# Patient Record
Sex: Male | Born: 1935 | Race: White | Hispanic: No | Marital: Married | Smoking: Former smoker
Health system: Southern US, Community
[De-identification: ages and names within clinical notes are randomized; demographics above are authoritative.]

## PROBLEM LIST (undated history)

## (undated) DIAGNOSIS — I4891 Unspecified atrial fibrillation: Secondary | ICD-10-CM

## (undated) DIAGNOSIS — I639 Cerebral infarction, unspecified: Secondary | ICD-10-CM

## (undated) DIAGNOSIS — Z952 Presence of prosthetic heart valve: Secondary | ICD-10-CM

## (undated) DIAGNOSIS — K5190 Ulcerative colitis, unspecified, without complications: Secondary | ICD-10-CM

## (undated) DIAGNOSIS — J329 Chronic sinusitis, unspecified: Secondary | ICD-10-CM

## (undated) DIAGNOSIS — R4701 Aphasia: Secondary | ICD-10-CM

## (undated) DIAGNOSIS — N400 Enlarged prostate without lower urinary tract symptoms: Secondary | ICD-10-CM

## (undated) DIAGNOSIS — E785 Hyperlipidemia, unspecified: Secondary | ICD-10-CM

## (undated) DIAGNOSIS — I10 Essential (primary) hypertension: Secondary | ICD-10-CM

## (undated) DIAGNOSIS — I2510 Atherosclerotic heart disease of native coronary artery without angina pectoris: Secondary | ICD-10-CM

## (undated) HISTORY — DX: Chronic sinusitis, unspecified: J32.9

## (undated) HISTORY — DX: Presence of prosthetic heart valve: Z95.2

## (undated) HISTORY — DX: Unspecified atrial fibrillation: I48.91

## (undated) HISTORY — DX: Essential (primary) hypertension: I10

## (undated) HISTORY — DX: Atherosclerotic heart disease of native coronary artery without angina pectoris: I25.10

## (undated) HISTORY — PX: CARDIAC SURGERY: SHX584

## (undated) HISTORY — DX: Hyperlipidemia, unspecified: E78.5

## (undated) HISTORY — DX: Benign prostatic hyperplasia without lower urinary tract symptoms: N40.0

## (undated) HISTORY — PX: AORTIC VALVE REPAIR: SHX6306

## (undated) HISTORY — DX: Aphasia: R47.01

## (undated) HISTORY — PX: LUNG SURGERY: SHX703

---

## 2007-05-27 ENCOUNTER — Ambulatory Visit: Payer: Self-pay | Admitting: Thoracic Surgery (Cardiothoracic Vascular Surgery)

## 2007-06-17 ENCOUNTER — Ambulatory Visit: Payer: Self-pay | Admitting: Cardiothoracic Surgery

## 2007-06-25 ENCOUNTER — Ambulatory Visit: Payer: Self-pay | Admitting: Thoracic Surgery (Cardiothoracic Vascular Surgery)

## 2007-07-29 ENCOUNTER — Ambulatory Visit: Payer: Self-pay | Admitting: Thoracic Surgery (Cardiothoracic Vascular Surgery)

## 2012-12-06 ENCOUNTER — Observation Stay (HOSPITAL_COMMUNITY): Payer: Medicare Other

## 2012-12-06 ENCOUNTER — Observation Stay (HOSPITAL_BASED_OUTPATIENT_CLINIC_OR_DEPARTMENT_OTHER)
Admission: EM | Admit: 2012-12-06 | Discharge: 2012-12-08 | Disposition: A | Discharge status: Home / Self Care | Payer: Medicare Other | Attending: Family Medicine | Admitting: Family Medicine

## 2012-12-06 ENCOUNTER — Emergency Department (HOSPITAL_BASED_OUTPATIENT_CLINIC_OR_DEPARTMENT_OTHER): Payer: Medicare Other

## 2012-12-06 ENCOUNTER — Encounter (HOSPITAL_BASED_OUTPATIENT_CLINIC_OR_DEPARTMENT_OTHER): Payer: Self-pay | Admitting: *Deleted

## 2012-12-06 DIAGNOSIS — I6350 Cerebral infarction due to unspecified occlusion or stenosis of unspecified cerebral artery: Principal | ICD-10-CM | POA: Insufficient documentation

## 2012-12-06 DIAGNOSIS — J329 Chronic sinusitis, unspecified: Secondary | ICD-10-CM

## 2012-12-06 DIAGNOSIS — I639 Cerebral infarction, unspecified: Secondary | ICD-10-CM

## 2012-12-06 DIAGNOSIS — G459 Transient cerebral ischemic attack, unspecified: Secondary | ICD-10-CM

## 2012-12-06 DIAGNOSIS — I10 Essential (primary) hypertension: Secondary | ICD-10-CM

## 2012-12-06 DIAGNOSIS — Z952 Presence of prosthetic heart valve: Secondary | ICD-10-CM

## 2012-12-06 DIAGNOSIS — N400 Enlarged prostate without lower urinary tract symptoms: Secondary | ICD-10-CM

## 2012-12-06 DIAGNOSIS — I059 Rheumatic mitral valve disease, unspecified: Secondary | ICD-10-CM | POA: Insufficient documentation

## 2012-12-06 DIAGNOSIS — Z8673 Personal history of transient ischemic attack (TIA), and cerebral infarction without residual deficits: Secondary | ICD-10-CM | POA: Insufficient documentation

## 2012-12-06 DIAGNOSIS — R4701 Aphasia: Secondary | ICD-10-CM

## 2012-12-06 DIAGNOSIS — Z951 Presence of aortocoronary bypass graft: Secondary | ICD-10-CM | POA: Insufficient documentation

## 2012-12-06 DIAGNOSIS — I079 Rheumatic tricuspid valve disease, unspecified: Secondary | ICD-10-CM | POA: Insufficient documentation

## 2012-12-06 DIAGNOSIS — E785 Hyperlipidemia, unspecified: Secondary | ICD-10-CM

## 2012-12-06 HISTORY — DX: Cerebral infarction, unspecified: I63.9

## 2012-12-06 LAB — COMPREHENSIVE METABOLIC PANEL
ALT: 22 U/L (ref 0–53)
AST: 25 U/L (ref 0–37)
Alkaline Phosphatase: 74 U/L (ref 39–117)
CO2: 28 mEq/L (ref 19–32)
GFR calc Af Amer: 56 mL/min — ABNORMAL LOW (ref >90)
Glucose, Bld: 124 mg/dL — ABNORMAL HIGH (ref 70–99)
Potassium: 3.9 mEq/L (ref 3.5–5.1)
Sodium: 141 mEq/L (ref 135–145)
Total Protein: 7.8 g/dL (ref 6.0–8.3)

## 2012-12-06 LAB — PROTIME-INR
INR: 1.04 (ref 0.00–1.49)
Prothrombin Time: 13.5 seconds (ref 11.6–15.2)

## 2012-12-06 LAB — CREATININE, SERUM: GFR calc Af Amer: 62 mL/min — ABNORMAL LOW (ref >90)

## 2012-12-06 LAB — URINALYSIS, ROUTINE W REFLEX MICROSCOPIC
Bilirubin Urine: NEGATIVE
Hgb urine dipstick: NEGATIVE
Ketones, ur: NEGATIVE mg/dL
Nitrite: NEGATIVE
Specific Gravity, Urine: 1.012 (ref 1.005–1.030)
pH: 6 (ref 5.0–8.0)

## 2012-12-06 LAB — GLUCOSE, CAPILLARY: Glucose-Capillary: 133 mg/dL — ABNORMAL HIGH (ref 70–99)

## 2012-12-06 LAB — CBC
HCT: 35.4 % — ABNORMAL LOW (ref 39.0–52.0)
Hemoglobin: 12.3 g/dL — ABNORMAL LOW (ref 13.0–17.0)
MCHC: 34.7 g/dL (ref 30.0–36.0)
MCV: 92.2 fL (ref 78.0–100.0)
Platelets: 160 10*3/uL (ref 150–400)
RBC: 3.91 MIL/uL — ABNORMAL LOW (ref 4.22–5.81)
RDW: 12.6 % (ref 11.5–15.5)
RDW: 12.9 % (ref 11.5–15.5)
WBC: 9.2 10*3/uL (ref 4.0–10.5)

## 2012-12-06 LAB — DIFFERENTIAL
Basophils Absolute: 0.1 10*3/uL (ref 0.0–0.1)
Eosinophils Absolute: 0.3 10*3/uL (ref 0.0–0.7)
Lymphocytes Relative: 22 % (ref 12–46)
Lymphs Abs: 2.1 10*3/uL (ref 0.7–4.0)
Neutrophils Relative %: 65 % (ref 43–77)

## 2012-12-06 MED ORDER — SODIUM CHLORIDE 0.9 % IV SOLN
INTRAVENOUS | Status: AC
  Administered 2012-12-06: 19:00:00 via INTRAVENOUS

## 2012-12-06 MED ORDER — ONDANSETRON HCL 4 MG/2ML IJ SOLN: 4.0000 mg | Freq: Three times a day (TID) | INTRAMUSCULAR | Status: AC | PRN

## 2012-12-06 MED ORDER — FLUTICASONE PROPIONATE 50 MCG/ACT NA SUSP
1.0000 | Freq: Every day | NASAL | Status: DC
  Administered 2012-12-07 – 2012-12-08 (×2): 1 via NASAL
  Filled 2012-12-06: qty 16

## 2012-12-06 MED ORDER — ENOXAPARIN SODIUM 40 MG/0.4ML ~~LOC~~ SOLN
40.0000 mg | SUBCUTANEOUS | Status: DC
  Administered 2012-12-06 – 2012-12-07 (×2): 40 mg via SUBCUTANEOUS
  Filled 2012-12-06 (×3): qty 0.4

## 2012-12-06 MED ORDER — CLOPIDOGREL BISULFATE 75 MG PO TABS
75.0000 mg | ORAL_TABLET | Freq: Every day | ORAL | Status: DC
  Administered 2012-12-07 – 2012-12-08 (×2): 75 mg via ORAL
  Filled 2012-12-06 (×3): qty 1

## 2012-12-06 MED ORDER — CEFUROXIME AXETIL 250 MG PO TABS
250.0000 mg | ORAL_TABLET | Freq: Two times a day (BID) | ORAL | Status: DC
  Administered 2012-12-06 – 2012-12-08 (×5): 250 mg via ORAL
  Filled 2012-12-06 (×6): qty 1

## 2012-12-06 MED ORDER — ACETAMINOPHEN 325 MG PO TABS: 650.0000 mg | ORAL_TABLET | ORAL | Status: DC | PRN

## 2012-12-06 MED ORDER — ONDANSETRON HCL 4 MG/2ML IJ SOLN: 4.0000 mg | Freq: Four times a day (QID) | INTRAMUSCULAR | Status: DC | PRN

## 2012-12-06 MED ORDER — LORAZEPAM 0.5 MG PO TABS
0.5000 mg | ORAL_TABLET | Freq: Three times a day (TID) | ORAL | Status: DC
  Administered 2012-12-06 – 2012-12-07 (×2): 0.5 mg via ORAL
  Filled 2012-12-06 (×2): qty 1

## 2012-12-06 MED ORDER — MESALAMINE 1.2 G PO TBEC
1200.0000 mg | DELAYED_RELEASE_TABLET | Freq: Every day | ORAL | Status: DC
  Administered 2012-12-07 – 2012-12-08 (×2): 1.2 g via ORAL
  Filled 2012-12-06 (×3): qty 1

## 2012-12-06 MED ORDER — SIMVASTATIN 40 MG PO TABS
40.0000 mg | ORAL_TABLET | Freq: Every day | ORAL | Status: DC
  Administered 2012-12-06 – 2012-12-08 (×3): 40 mg via ORAL
  Filled 2012-12-06 (×3): qty 1

## 2012-12-06 MED ORDER — LORATADINE 10 MG PO TABS
10.0000 mg | ORAL_TABLET | Freq: Every day | ORAL | Status: DC
  Administered 2012-12-07 – 2012-12-08 (×2): 10 mg via ORAL
  Filled 2012-12-06 (×3): qty 1

## 2012-12-06 MED ORDER — CLOPIDOGREL BISULFATE 75 MG PO TABS
75.0000 mg | ORAL_TABLET | Freq: Once | ORAL | Status: AC
  Administered 2012-12-06: 75 mg via ORAL
  Filled 2012-12-06: qty 1

## 2012-12-06 MED ORDER — TAMSULOSIN HCL 0.4 MG PO CAPS
0.4000 mg | ORAL_CAPSULE | Freq: Every day | ORAL | Status: DC
Start: 2012-12-06 — End: 2012-12-08
  Administered 2012-12-06 – 2012-12-08 (×3): 0.4 mg via ORAL
  Filled 2012-12-06 (×3): qty 1

## 2012-12-06 NOTE — H&P (Signed)
History and Physical       Hospital Admission Note Date: 12/06/2012  Patient name: Caleb Delgado Medical record number: [id number] Date of birth: May 12, 1926 Age: 79 y.o. Gender: male PCP: Sid Falcon, MD    Chief Complaint:  Slurred speech around noon today  HPI: Patient is a 79 year old male with prior history of TIA, hypertension, hyperlipidemia, history of aortic valve replacement presented with slurred speech and unknown today. History was obtained from the patient and his wife present in the room. Patient was in church this morning, subsequently went to a restaurant for lunch and was unable to order his food, around 12 noon. He was eventually able to eat the food but his speech was somewhat slurred. His wife got concerned and drove him to [facility] ED. EDP discussed with on-call neurologist who did not feel that patient was a candidate for thrombolytics and recommended transfer to [facility]  for stroke/TIA workup.  The patient also reported that he was somewhat dizzy yesterday however did not have any syncopal episode, chest pain, palpitations or shortness of breath.  Review of Systems:  Constitutional: Denies fever, chills, diaphoresis, appetite change and fatigue.  HEENT: Denies photophobia, eye pain, redness, hearing loss, ear pain, congestion, sore throat, rhinorrhea, sneezing, mouth sores, trouble swallowing, neck pain, neck stiffness and tinnitus.   Respiratory: Denies SOB, DOE, cough, chest tightness,  and wheezing.   Cardiovascular: Denies chest pain, palpitations and leg swelling.  Gastrointestinal: Denies nausea, vomiting, abdominal pain, diarrhea, constipation, blood in stool and abdominal distention.  Genitourinary: Denies dysuria, urgency, frequency, hematuria, flank pain and difficulty urinating.  Musculoskeletal: Denies myalgias, back pain, joint swelling, arthralgias and gait problem.  Skin: Denies pallor, rash and  wound.  Neurological: Please see history of present illness  Hematological: Denies adenopathy. Easy bruising, personal or family bleeding history  Psychiatric/Behavioral: Denies suicidal ideation, mood changes, confusion, nervousness, sleep disturbance and agitation  Past Medical History: Past Medical History  Diagnosis Date  . Stroke     TIA   Past Surgical History  Procedure Laterality Date  . Cardiac surgery    . Aortic valve repair    . Lung surgery      Medications: Prior to Admission medications   Medication Sig Start Date End Date Taking? Authorizing Provider  aspirin 325 MG EC tablet Take 325 mg by mouth daily.   Yes Historical Provider, MD  bisoprolol (ZEBETA) 5 MG tablet Take 5 mg by mouth daily.   Yes Historical Provider, MD  LORazepam (ATIVAN) 0.5 MG tablet Take 0.5 mg by mouth every 8 (eight) hours.   Yes Historical Provider, MD  mesalamine (LIALDA) 1.2 G EC tablet Take 1,200 mg by mouth daily with breakfast.   Yes Historical Provider, MD  nebivolol (BYSTOLIC) 10 MG tablet Take 5 mg by mouth daily.    Yes Historical Provider, MD  simvastatin (ZOCOR) 40 MG tablet Take 40 mg by mouth every evening.   Yes Historical Provider, MD  tamsulosin (FLOMAX) 0.4 MG CAPS Take 0.4 mg by mouth.   Yes Historical Provider, MD    Allergies:  No Known Allergies  Social History:  reports that he quit smoking about 42 years ago. He does not have any smokeless tobacco history on file. He reports that he does not drink alcohol. His drug history is not on file.  Family History: No family history on file.  Physical Exam: Blood pressure 135/65, pulse 60, temperature 98 F (36.7 C), temperature source Oral, resp. rate 16, height 6\' 2"  (  1.88 m), weight 79.289 kg (174 lb 12.8 oz), SpO2 99.00%. General: Alert, awake, oriented x3, in no acute distress, has some expressive aphasia during conversation  HEENT: normocephalic, atraumatic, anicteric sclera, pink conjunctiva, PERLA, oropharynx  clear Neck: supple, no masses or lymphadenopathy, no goiter, no bruits  Heart: Regular rate and rhythm, without murmurs, rubs or gallops. Lungs: Clear to auscultation bilaterally, no wheezing, rales or rhonchi. Abdomen: Soft, nontender, nondistended, positive bowel sounds, no masses. Extremities: No clubbing, cyanosis or edema with positive pedal pulses. Neuro:  cranial nerves II- XII intact, strength 5/5 upper and lower extremities bilaterally, some expressive aphasia gait not tested  Psych: alert and oriented x 3, normal mood and affect Skin: no rashes or lesions, warm and dry   LABS on Admission:  Basic Metabolic Panel:  Recent Labs Lab 12/06/12 1355  NA 141  K 3.9  CL 101  CO2 28  GLUCOSE 124*  BUN 19  CREATININE 1.30  CALCIUM 9.8   Liver Function Tests:  Recent Labs Lab 12/06/12 1355  AST 25  ALT 22  ALKPHOS 74  BILITOT 0.8  PROT 7.8  ALBUMIN 4.2   No results found for this basename: LIPASE, AMYLASE,  in the last 168 hours No results found for this basename: AMMONIA,  in the last 168 hours CBC:  Recent Labs Lab 12/06/12 1355  WBC 9.2  NEUTROABS 6.0  HGB 12.6*  HCT 36.9*  MCV 94.4  PLT 160   Cardiac Enzymes:  Recent Labs Lab 12/06/12 1355  TROPONINI <0.30   BNP: No components found with this basename: POCBNP,  CBG:  Recent Labs Lab 12/06/12 1414  GLUCAP 133*     Radiological Exams on Admission: Ct Head Wo Contrast  12/06/2012  *RADIOLOGY REPORT*  Clinical Data: Expressive aphasia, remote history of TIA, Code stroke  CT HEAD WITHOUT CONTRAST  Technique:  Contiguous axial images were obtained from the base of the skull through the vertex without contrast.  Comparison: None.  Findings:  There is mild diffuse atrophy with mild prominence of bifrontal extra-axial spaces and commiserate mild ex vacuo dilatation of the ventricular system.  Scattered periventricular hypodensities compatible microvascular ischemic disease.  There is no CT evidence of  acute large territory infarct.  No intraparenchymal or extra- axial mass or hemorrhage.  Normal size and configuration of the ventricles and basilar cisterns gave an volume loss.  No midline shift.  Air-fluid level with the right maxillary sinus.  Remaining paranasal sinuses and mastoid air cells are normally aerated.  Post bilateral cataract surgery.  Regional soft tissues are normal.  No displaced calvarial fracture.  IMPRESSION: 1.  No acute intracranial process. 2.  Mild, likely age appropriate, atrophy and microvascular ischemic disease. 3.  Air-fluid level with the right maxillary sinus, nonspecific but may be seen in the setting of acute sinusitis.  Above findings discussed with Dr. Hyacinth Meeker at 14:16.   Original Report Authenticated By: Tacey Ruiz, MD     Assessment/Plan Principal Problem:   Expressive aphasia: Possible TIA/ CVA with a history of prior TIA on aspirin - Will admit for TIA/CVA workup, obtain MRI/MRA brain, 2-D echo, carotid Dopplers - Patient has been on full dose aspirin prior to admission, he has been given one dose of Plavix at [facility], will continue Plavix - PT, OT, speech evaluation, swallow evaluation, heart healthy diet once able to swallow - Lipid panel, hemoglobin A1c - Neurology consulted, discussed with Dr. Cyril Mourning  Active Problems:   HTN (hypertension): Currently stable, hold off  on beta blocker to avoid hypotension    Other and unspecified hyperlipidemia: Continue statin, obtain lipid panel    BPH (benign prostatic hyperplasia): Continue Flomax    S/P AVR (aortic valve replacement)   Acute sinusitis on CT : - Placed on Claritin, Flonase spray and Ceftin for a week   DVT prophylaxis: lovenox  CODE STATUS: Full code  Further plan will depend as patient's clinical course evolves and further radiologic and laboratory data become available.   Time Spent on Admission: 1 hour  RAI,RIPUDEEP M.D. [facility] 12/06/2012, 5:43 PM Pager:  [phone]  If 7PM-7AM, please contact night-coverage [email or web address] Password [email or web address]

## 2012-12-06 NOTE — ED Provider Notes (Signed)
History     CSN: [id number]  Arrival date & time 12/06/12  1344   First MD Initiated Contact with Patient 12/06/12 1357      No chief complaint on file.   (Consider location/radiation/quality/duration/timing/severity/associated sxs/prior treatment) HPI Comments: Level V caveat apply secondary to patient's expressive aphasia  The patient is an 79 year old male with a history of hypertension, cardiac surgery for both aortic valve repair and cardiac bypass. He presents after his spouse found that he was unable to order his food at a restaurant. He she states that she last hurt his voice normal at 8:30 this morning before church started, at this time he is unable to answer questions appropriately, he has no difficulty following commands with his arms or legs and has a steady gait.  The history is provided by the patient and the spouse.    Past Medical History  Diagnosis Date  . Stroke     TIA    Past Surgical History  Procedure Laterality Date  . Cardiac surgery    . Aortic valve repair    . Lung surgery      No family history on file.  History  Substance Use Topics  . Smoking status: Former Games developer  . Smokeless tobacco: Not on file  . Alcohol Use: No      Review of Systems  Unable to perform ROS: Patient nonverbal    Allergies  Review of patient's allergies indicates no known allergies.  Home Medications   Current Outpatient Rx  Name  Route  Sig  Dispense  Refill  . aspirin 325 MG EC tablet   Oral   Take 325 mg by mouth daily.         . bisoprolol (ZEBETA) 5 MG tablet   Oral   Take 5 mg by mouth daily.         Marland Kitchen LORazepam (ATIVAN) 0.5 MG tablet   Oral   Take 0.5 mg by mouth every 8 (eight) hours.         . mesalamine (LIALDA) 1.2 G EC tablet   Oral   Take 1,200 mg by mouth daily with breakfast.         . nebivolol (BYSTOLIC) 10 MG tablet   Oral   Take 5 mg by mouth daily.          . simvastatin (ZOCOR) 40 MG tablet   Oral   Take 40 mg  by mouth every evening.         . tamsulosin (FLOMAX) 0.4 MG CAPS   Oral   Take 0.4 mg by mouth.           BP 132/52  Pulse 60  Temp(Src) 98.1 F (36.7 C) (Oral)  Resp 20  SpO2 100%  Physical Exam  Nursing note and vitals reviewed. Constitutional: He appears well-developed and well-nourished. No distress.  HENT:  Head: Normocephalic and atraumatic.  Mouth/Throat: Oropharynx is clear and moist. No oropharyngeal exudate.  Eyes: Conjunctivae and EOM are normal. Pupils are equal, round, and reactive to light. Right eye exhibits no discharge. Left eye exhibits no discharge. No scleral icterus.  Neck: Normal range of motion. Neck supple. No JVD present. No thyromegaly present.  Cardiovascular: Normal rate, regular rhythm, normal heart sounds and intact distal pulses.  Exam reveals no gallop and no friction rub.   No murmur heard. Pulmonary/Chest: Effort normal and breath sounds normal. No respiratory distress. He has no wheezes. He has no rales.  Abdominal: Soft. Bowel sounds are  normal. He exhibits no distension and no mass. There is no tenderness.  Musculoskeletal: Normal range of motion. He exhibits no edema and no tenderness.  Lymphadenopathy:    He has no cervical adenopathy.  Neurological: He is alert. Coordination normal.  The patient has expressive aphasia, normal memory, normal coordination of all 4 extremities without any limb ataxia. The patient has normal strength of all 4 extremities at the major muscle groups including shoulders, biceps, triceps, forearm, grips, quadriceps, hamstrings, calves. Normal sensation to light touch and pinprick of all 4 extremities and the trunk. No truncal ataxia, normal gait, cranial nerves III through XII are intact.  Normal peripheral visual fields. Normal extraocular movements. Normal pupillary exam. No pronator drift, normal reflexes at the brachial radialis, biceps and patellar tendons. Normal position sense, normal temperature sensation to  cold and warm  Skin: Skin is warm and dry. No rash noted. No erythema.  Psychiatric: He has a normal mood and affect. His behavior is normal.    ED Course  Procedures (including critical care time)  Labs Reviewed  CBC - Abnormal; Notable for the following:    RBC 3.91 (*)    Hemoglobin 12.6 (*)    HCT 36.9 (*)    All other components within normal limits  GLUCOSE, CAPILLARY - Abnormal; Notable for the following:    Glucose-Capillary 133 (*)    All other components within normal limits  PROTIME-INR  APTT  DIFFERENTIAL  ETHANOL  COMPREHENSIVE METABOLIC PANEL  URINALYSIS, ROUTINE W REFLEX MICROSCOPIC  TROPONIN I   Ct Head Wo Contrast  12/06/2012  *RADIOLOGY REPORT*  Clinical Data: Expressive aphasia, remote history of TIA, Code stroke  CT HEAD WITHOUT CONTRAST  Technique:  Contiguous axial images were obtained from the base of the skull through the vertex without contrast.  Comparison: None.  Findings:  There is mild diffuse atrophy with mild prominence of bifrontal extra-axial spaces and commiserate mild ex vacuo dilatation of the ventricular system.  Scattered periventricular hypodensities compatible microvascular ischemic disease.  There is no CT evidence of acute large territory infarct.  No intraparenchymal or extra- axial mass or hemorrhage.  Normal size and configuration of the ventricles and basilar cisterns gave an volume loss.  No midline shift.  Air-fluid level with the right maxillary sinus.  Remaining paranasal sinuses and mastoid air cells are normally aerated.  Post bilateral cataract surgery.  Regional soft tissues are normal.  No displaced calvarial fracture.  IMPRESSION: 1.  No acute intracranial process. 2.  Mild, likely age appropriate, atrophy and microvascular ischemic disease. 3.  Air-fluid level with the right maxillary sinus, nonspecific but may be seen in the setting of acute sinusitis.  Above findings discussed with Dr. Hyacinth Meeker at 14:16.   Original Report Authenticated  By: Tacey Ruiz, MD      1. Stroke       MDM  The patient has had cardiac surgery, he is not have any carotid bruits his EKG shows LVH with intraventricular conduction delay but no signs of acute ischemia. I have activated code stroke as the patient was last seen normal 5-1/2 hours ago. Will discuss with the neurologist, vital signs are stable without any significant hypertension at this time.   ED ECG REPORT  I personally interpreted this EKG   Date: 12/06/2012   Rate: 61  Rhythm: normal sinus rhythm  QRS Axis: normal  Intervals: PR prolonged  ST/T Wave abnormalities: normal  Conduction Disutrbances:first-degree A-V block  and nonspecific intraventricular conduction delay  Narrative Interpretation: Left ventricular hypertrophy  Old EKG Reviewed: none available   On arrival after my immediate evaluation the patient was found to have strokelike symptoms and I paged a code stroke immediately. Discussion with the neurologist agreed that given his time frame under 6 hours that he was not a candidate for intravenous thrombolytics and after viewing the CT scan without any evidence of a large territory infarct it was determined that the patient would not be a candidate for local thrombolytics either. I have discussed his care with the [facility] hospitalist who will admit the patient to the hospital, Dr. Brien Few will admit to neuro telemetry floor.  Recommendations on medications including aspirin and Plavix      Vida Roller, MD 12/06/12 1440

## 2012-12-06 NOTE — Progress Notes (Signed)
Patient arrived from [facility]. Stroke swallow was completed and documented from that location. Diet for Heart Healthy was placed.

## 2012-12-06 NOTE — ED Notes (Signed)
Patient presents with garbled speech, unable to get out his words, answers questions inappropriately. Gait and motor intact. Patient has had a TIA in 1982. Last seen normal per wife was apx 8:30am prior to church this morning. States that at lunch she noticed he was unable to get the correct order spoken.

## 2012-12-06 NOTE — ED Notes (Signed)
Patients speech improving from when he first arrived into the ER.

## 2012-12-06 NOTE — Consult Note (Signed)
Referring Physician: Rai    Chief Complaint: Difficulty with speech  HPI: Caleb Delgado is an 79 y.o. male who reports awakening today normal.  The patient went to church but did not have an opportunity to speak until after church when he went to lunch and was attempting to order.  He was unable to get his words out correctly.  Speech was garbled.  Speech did not improve.  Patient went home then soon presented to the ED for further evaluation.  Speech did improve not long after presentation but has not returned to baseline.   Patient does report feeling poorly yesterday.  Reports feeling weak and dizzy.  This improved by 10AM and patient was normal when he went to bed last evening.  Does not report recurrence of these symptoms today.    LSN: [id number] tPA Given: No: Outside time window  Past Medical History  Diagnosis Date  . Stroke     TIA    Past Surgical History  Procedure Laterality Date  . Cardiac surgery    . Aortic valve repair    . Lung surgery      Family history:  Parents are deceased.  Both died of strokes.  He has 6 siblings. 5 siblings have passed of COPD, stroke, bowel obstruction, CHF.  His living sibling has macular degeneration.    Social History:  reports that he quit smoking about 42 years ago. He does not have any smokeless tobacco history on file. He reports that he does not drink alcohol. His drug history is not on file.  Allergies: No Known Allergies  Medications:  I have reviewed the patient's current medications. Prior to Admission:  Prescriptions prior to admission  Medication Sig Dispense Refill  . aspirin 325 MG EC tablet Take 325 mg by mouth daily.      Marland Kitchen LORazepam (ATIVAN) 0.5 MG tablet Take 0.5 mg by mouth at bedtime.      . mesalamine (LIALDA) 1.2 G EC tablet Take 1,200 mg by mouth daily with breakfast.      . nebivolol (BYSTOLIC) 10 MG tablet Take 5 mg by mouth daily.       . simvastatin (ZOCOR) 40 MG tablet Take 40 mg by mouth every evening.       . tamsulosin (FLOMAX) 0.4 MG CAPS Take 0.4 mg by mouth daily after supper.       Scheduled: . cefUROXime  250 mg Oral BID WC  . [START ON 12/07/2012] clopidogrel  75 mg Oral Q breakfast  . enoxaparin (LOVENOX) injection  40 mg Subcutaneous Q24H  . fluticasone  1 spray Each Nare Daily  . loratadine  10 mg Oral Daily  . LORazepam  0.5 mg Oral Q8H  . [START ON 12/07/2012] mesalamine  1,200 mg Oral Q breakfast  . simvastatin  40 mg Oral q1800  . tamsulosin  0.4 mg Oral QPC supper    ROS: History obtained from the patient  General ROS: negative for - chills, fatigue, fever, night sweats, weight gain or weight loss Psychological ROS: negative for - behavioral disorder, hallucinations, memory difficulties, mood swings or suicidal ideation Ophthalmic ROS: negative for - blurry vision, double vision, eye pain or loss of vision ENT ROS: negative for - epistaxis, nasal discharge, oral lesions, sore throat, tinnitus or vertigo Allergy and Immunology ROS: negative for - hives or itchy/watery eyes Hematological and Lymphatic ROS: negative for - bleeding problems, bruising or swollen lymph nodes Endocrine ROS: negative for - galactorrhea, hair pattern changes, polydipsia/polyuria  or temperature intolerance Respiratory ROS: negative for - cough, hemoptysis, shortness of breath or wheezing Cardiovascular ROS: negative for - chest pain, dyspnea on exertion, edema or irregular heartbeat Gastrointestinal ROS: negative for - abdominal pain, diarrhea, hematemesis, nausea/vomiting or stool incontinence Genito-Urinary ROS: negative for - dysuria, hematuria, incontinence or urinary frequency/urgency Musculoskeletal ROS: negative for - joint swelling or muscular weakness Neurological ROS: as noted in HPI Dermatological ROS: negative for rash and skin lesion changes  Physical Examination: Blood pressure 135/65, pulse 60, temperature 98 F (36.7 C), temperature source Oral, resp. rate 16, height 6\' 2"  (1.88 m),  weight 79.289 kg (174 lb 12.8 oz), SpO2 99.00%.  Neurologic Examination: Mental Status: Alert, oriented, thought content appropriate.  Speech nonfluent with word finding difficulties noted.  Able to follow 3 step commands without difficulty. Cranial Nerves: II: Discs flat bilaterally; Visual fields grossly normal, pupils equal, round, reactive to light and accommodation III,IV, VI: ptosis not present, extra-ocular motions intact bilaterally V,VII: mild right facial droop, facial light touch sensation normal bilaterally VIII: hearing normal bilaterally IX,X: gag reflex present XI: bilateral shoulder shrug XII: midline tongue extension Motor: Right : Upper extremity   5/5 w/5- hand grip    Left:     Upper extremity   5/5  Lower extremity   5/5       Lower extremity   5/5 Tone and bulk:normal tone throughout; no atrophy noted Sensory: Pinprick and light touch intact throughout, bilaterally Deep Tendon Reflexes: 2+ in the upper extremities and at the knees bilaterally.  Absent AJ's. Plantars: Right: downgoing   Left: upgoing Cerebellar: normal finger-to-nose and normal heel-to-shin test Gait: Unable to test CV: pulses palpable throughout   Laboratory Studies:  Basic Metabolic Panel:  Recent Labs Lab 12/06/12 1355 12/06/12 1741  NA 141  --   K 3.9  --   CL 101  --   CO2 28  --   GLUCOSE 124*  --   BUN 19  --   CREATININE 1.30 1.21  CALCIUM 9.8  --     Liver Function Tests:  Recent Labs Lab 12/06/12 1355  AST 25  ALT 22  ALKPHOS 74  BILITOT 0.8  PROT 7.8  ALBUMIN 4.2   No results found for this basename: LIPASE, AMYLASE,  in the last 168 hours No results found for this basename: AMMONIA,  in the last 168 hours  CBC:  Recent Labs Lab 12/06/12 1355 12/06/12 1741  WBC 9.2 8.6  NEUTROABS 6.0  --   HGB 12.6* 12.3*  HCT 36.9* 35.4*  MCV 94.4 92.2  PLT 160 150    Cardiac Enzymes:  Recent Labs Lab 12/06/12 1355  TROPONINI <0.30    BNP: No components  found with this basename: POCBNP,   CBG:  Recent Labs Lab 12/06/12 1414  GLUCAP 133*    Microbiology: No results found for this or any previous visit.  Coagulation Studies:  Recent Labs  12/06/12 1355  LABPROT 13.5  INR 1.04    Urinalysis:  Recent Labs Lab 12/06/12 1540  COLORURINE YELLOW  LABSPEC 1.012  PHURINE 6.0  GLUCOSEU NEGATIVE  HGBUR NEGATIVE  BILIRUBINUR NEGATIVE  KETONESUR NEGATIVE  PROTEINUR NEGATIVE  UROBILINOGEN 0.2  NITRITE NEGATIVE  LEUKOCYTESUR NEGATIVE    Lipid Panel: No results found for this basename: chol, trig, hdl, cholhdl, vldl, ldlcalc    HgbA1C:  No results found for this basename: HGBA1C    Urine Drug Screen:   No results found for this basename: labopia, cocainscrnur, labbenz,  amphetmu, thcu, labbarb    Alcohol Level:  Recent Labs Lab 12/06/12 1355  ETH <11    Imaging: Ct Head Wo Contrast  12/06/2012  *RADIOLOGY REPORT*  Clinical Data: Expressive aphasia, remote history of TIA, Code stroke  CT HEAD WITHOUT CONTRAST  Technique:  Contiguous axial images were obtained from the base of the skull through the vertex without contrast.  Comparison: None.  Findings:  There is mild diffuse atrophy with mild prominence of bifrontal extra-axial spaces and commiserate mild ex vacuo dilatation of the ventricular system.  Scattered periventricular hypodensities compatible microvascular ischemic disease.  There is no CT evidence of acute large territory infarct.  No intraparenchymal or extra- axial mass or hemorrhage.  Normal size and configuration of the ventricles and basilar cisterns gave an volume loss.  No midline shift.  Air-fluid level with the right maxillary sinus.  Remaining paranasal sinuses and mastoid air cells are normally aerated.  Post bilateral cataract surgery.  Regional soft tissues are normal.  No displaced calvarial fracture.  IMPRESSION: 1.  No acute intracranial process. 2.  Mild, likely age appropriate, atrophy and  microvascular ischemic disease. 3.  Air-fluid level with the right maxillary sinus, nonspecific but may be seen in the setting of acute sinusitis.  Above findings discussed with Dr. Hyacinth Meeker at 14:16.   Original Report Authenticated By: Tacey Ruiz, MD     Assessment: 79 y.o. male presenting with an expressive aphasia.  Able to follow commands and understand speech.  Mild right focality noted.  Head CT unremarkable.  Acute infarct suspected.  Patient outside window for tPA.  On ASA at home.    Stroke Risk Factors - none  Plan: 1. HgbA1c, fasting lipid panel 2. MRI, MRA  of the brain without contrast 3. PT consult, OT consult, Speech consult 4. Echocardiogram 5. Carotid dopplers 6. Prophylactic therapy-Antiplatelet med: Plavix - dose 75mg  daily 7. Risk factor modification 8. Telemetry monitoring 9. Frequent neuro checks   Thana Farr, MD [facility] [phone] 12/06/2012, 7:31 PM

## 2012-12-06 NOTE — ED Notes (Signed)
Patient transported to CT 

## 2012-12-07 DIAGNOSIS — R4789 Other speech disturbances: Secondary | ICD-10-CM

## 2012-12-07 NOTE — Progress Notes (Signed)
  Echocardiogram 2D Echocardiogram has been performed.  CHUNG, KWONG 12/07/2012, 12:00 PM

## 2012-12-07 NOTE — Progress Notes (Signed)
[facility] PROGRESS NOTE  Lugene Beougher WJX:[id number] DOB: [birth year] DOA: 12/06/2012 PCP: Sid Falcon, MD  Assessment/Plan: 1. CVA-MRI shows  Small acute infarct posterior left temporal lobe (posterior left  opercular region) and left periatrial region.  Patient is currently on aspirin, carotid dopplers are negative, TEE has been ordered for am.  2.Hypertension- BP has been low, antihypertensives are on hold.  3.Hyperlipidemia- continue zocor  4. BPH- continue with flomax.   Code Status: Full code Family Communication: Discussed with patient's wife at bedside Disposition Plan: To be deternmined   Consultants:  Neurology  Procedures:  Carotid dopplers  2 d echo  Antibiotics:  None  HPI/Subjective: Patient admitted with slurred speech and expressive aphasia, though he has improved he still has some expressive aphasia.  Objective: Filed Vitals:   12/07/12 0147 12/07/12 0601 12/07/12 1113 12/07/12 1425  BP: 118/50 130/58 122/52 107/53  Pulse: 52 51 54 56  Temp: 98 F (36.7 C) 97.9 F (36.6 C) 98 F (36.7 C) 97.8 F (36.6 C)  TempSrc: Oral Oral    Resp: 20 20 16 16   Height:      Weight:      SpO2: 97% 98% 100% 98%    Intake/Output Summary (Last 24 hours) at 12/07/12 1512 Last data filed at 12/07/12 0815  Gross per 24 hour  Intake    360 ml  Output      0 ml  Net    360 ml   Filed Weights   12/06/12 1537 12/06/12 1905  Weight: 79.289 kg (174 lb 12.8 oz) 78.744 kg (173 lb 9.6 oz)    Exam:   General:  Appears in no acute disterss  Cardiovascular: s1s2 RRR  Respiratory: Clear bilaterally  Abdomen: soft, nontender  Neuro: AO x 3, moving all extremities, has some expressive aphasia   Data Reviewed: Basic Metabolic Panel:  Recent Labs Lab 12/06/12 1355 12/06/12 1741  NA 141  --   K 3.9  --   CL 101  --   CO2 28  --   GLUCOSE 124*  --   BUN 19  --   CREATININE 1.30 1.21  CALCIUM 9.8  --    Liver Function  Tests:  Recent Labs Lab 12/06/12 1355  AST 25  ALT 22  ALKPHOS 74  BILITOT 0.8  PROT 7.8  ALBUMIN 4.2   No results found for this basename: LIPASE, AMYLASE,  in the last 168 hours No results found for this basename: AMMONIA,  in the last 168 hours CBC:  Recent Labs Lab 12/06/12 1355 12/06/12 1741  WBC 9.2 8.6  NEUTROABS 6.0  --   HGB 12.6* 12.3*  HCT 36.9* 35.4*  MCV 94.4 92.2  PLT 160 150   Cardiac Enzymes:  Recent Labs Lab 12/06/12 1355  TROPONINI <0.30   BNP (last 3 results) No results found for this basename: PROBNP,  in the last 8760 hours CBG:  Recent Labs Lab 12/06/12 1414  GLUCAP 133*    No results found for this or any previous visit (from the past 240 hour(s)).   Studies: Ct Head Wo Contrast  12/06/2012  *RADIOLOGY REPORT*  Clinical Data: Expressive aphasia, remote history of TIA, Code stroke  CT HEAD WITHOUT CONTRAST  Technique:  Contiguous axial images were obtained from the base of the skull through the vertex without contrast.  Comparison: None.  Findings:  There is mild diffuse atrophy with mild prominence of bifrontal extra-axial spaces and commiserate mild ex vacuo dilatation of the  ventricular system.  Scattered periventricular hypodensities compatible microvascular ischemic disease.  There is no CT evidence of acute large territory infarct.  No intraparenchymal or extra- axial mass or hemorrhage.  Normal size and configuration of the ventricles and basilar cisterns gave an volume loss.  No midline shift.  Air-fluid level with the right maxillary sinus.  Remaining paranasal sinuses and mastoid air cells are normally aerated.  Post bilateral cataract surgery.  Regional soft tissues are normal.  No displaced calvarial fracture.  IMPRESSION: 1.  No acute intracranial process. 2.  Mild, likely age appropriate, atrophy and microvascular ischemic disease. 3.  Air-fluid level with the right maxillary sinus, nonspecific but may be seen in the setting of acute  sinusitis.  Above findings discussed with Dr. Hyacinth Meeker at 14:16.   Original Report Authenticated By: Tacey Ruiz, MD    Mri Brain Without Contrast  12/06/2012  *RADIOLOGY REPORT*  Clinical Data:  Difficulty gathering words.  Expressive ectasia. Remote infarct.  MRI BRAIN WITHOUT CONTRAST MRA HEAD WITHOUT CONTRAST  Technique: Multiplanar, multiecho pulse sequences of the brain and surrounding structures were obtained according to standard protocol without intravenous contrast.  Angiographic images of the head were obtained using MRA technique without contrast.  Comparison: 12/06/2012 head CT.  MRI HEAD  Findings:  Small acute infarct posterior left temporal lobe (posterior left opercular region) and left periatrial region.  At the level of the posterior left frontal lobe infarct, there is a curvilinear structure which is suspect represents thrombus within the left middle cerebral artery vessel rather than hemorrhage. There is slow flow within branches distal to this region as best appreciated on FLAIR imaging.  Global atrophy without hydrocephalus.  No intracranial mass lesion detected on this unenhanced exam.  Right maxillary sinus is small mild to moderate mucosal thickening. Mild mucosal thickening ethmoid sinus air cells.  IMPRESSION: Small acute infarct posterior left temporal lobe (posterior left opercular region) and left periatrial region.  Please see above  MRA HEAD  Findings: Aplastic A1 segment right anterior cerebral artery.  The left internal carotid artery is larger than the right internal carotid artery which may be congenital although proximal right internal carotid artery stenosis not  excluded.  Decreased number visualized left middle cerebral artery branches consistent with the patient's acute infarct.  Tiny bulge superior border M1 segment right middle cerebral artery probably origin of a vessel rather than aneurysm.  Ectatic vertebral arteries and basilar artery.  Right vertebral artery is  dominant.  Moderate narrowing left vertebral artery. Nonvisualization left PICA.  No significant stenosis basilar artery.  Nonvisualization PICAs.  Mild narrowing and posterior cerebral artery distal branches more notable on the right.  IMPRESSION: Decreased number visualized left middle cerebral artery branches consistent with the patient's acute infarct.  Moderate narrowing left vertebral artery.  Nonvisualization left PICA.  Please see above.  Critical Value/emergent results were called by telephone at the time of interpretation on 12/06/2012 at 8:32 p.m. to Intha the patient's nurse, who verbally acknowledged these results.   Original Report Authenticated By: Lacy Duverney, M.D.    Mr Mra Head/brain Wo Cm  12/06/2012  *RADIOLOGY REPORT*  Clinical Data:  Difficulty gathering words.  Expressive ectasia. Remote infarct.  MRI BRAIN WITHOUT CONTRAST MRA HEAD WITHOUT CONTRAST  Technique: Multiplanar, multiecho pulse sequences of the brain and surrounding structures were obtained according to standard protocol without intravenous contrast.  Angiographic images of the head were obtained using MRA technique without contrast.  Comparison: 12/06/2012 head CT.  MRI HEAD  Findings:  Small acute infarct posterior left temporal lobe (posterior left opercular region) and left periatrial region.  At the level of the posterior left frontal lobe infarct, there is a curvilinear structure which is suspect represents thrombus within the left middle cerebral artery vessel rather than hemorrhage. There is slow flow within branches distal to this region as best appreciated on FLAIR imaging.  Global atrophy without hydrocephalus.  No intracranial mass lesion detected on this unenhanced exam.  Right maxillary sinus is small mild to moderate mucosal thickening. Mild mucosal thickening ethmoid sinus air cells.  IMPRESSION: Small acute infarct posterior left temporal lobe (posterior left opercular region) and left periatrial region.  Please  see above  MRA HEAD  Findings: Aplastic A1 segment right anterior cerebral artery.  The left internal carotid artery is larger than the right internal carotid artery which may be congenital although proximal right internal carotid artery stenosis not  excluded.  Decreased number visualized left middle cerebral artery branches consistent with the patient's acute infarct.  Tiny bulge superior border M1 segment right middle cerebral artery probably origin of a vessel rather than aneurysm.  Ectatic vertebral arteries and basilar artery.  Right vertebral artery is dominant.  Moderate narrowing left vertebral artery. Nonvisualization left PICA.  No significant stenosis basilar artery.  Nonvisualization PICAs.  Mild narrowing and posterior cerebral artery distal branches more notable on the right.  IMPRESSION: Decreased number visualized left middle cerebral artery branches consistent with the patient's acute infarct.  Moderate narrowing left vertebral artery.  Nonvisualization left PICA.  Please see above.  Critical Value/emergent results were called by telephone at the time of interpretation on 12/06/2012 at 8:32 p.m. to Intha the patient's nurse, who verbally acknowledged these results.   Original Report Authenticated By: Lacy Duverney, M.D.     Scheduled Meds: . cefUROXime  250 mg Oral BID WC  . clopidogrel  75 mg Oral Q breakfast  . enoxaparin (LOVENOX) injection  40 mg Subcutaneous Q24H  . fluticasone  1 spray Each Nare Daily  . loratadine  10 mg Oral Daily  . LORazepam  0.5 mg Oral Q8H  . mesalamine  1,200 mg Oral Q breakfast  . simvastatin  40 mg Oral q1800  . tamsulosin  0.4 mg Oral QPC supper   Continuous Infusions: . sodium chloride 75 mL/hr at 12/06/12 1902    Principal Problem:   Expressive aphasia Active Problems:   HTN (hypertension)   Other and unspecified hyperlipidemia   BPH (benign prostatic hyperplasia)   history of TIA   S/P AVR (aortic valve replacement)   Unspecified  sinusitis (chronic)    Time spent: 25 min    [facility]  [facility] Pager [phone]. If 7PM-7AM, please contact night-coverage at [email or web address], password [facility] 12/07/2012, 3:12 PM  LOS: 1 day

## 2012-12-07 NOTE — Evaluation (Signed)
Occupational Therapy Evaluation Patient Details Name: Caleb Delgado MRN: [id number] DOB: [birth year] Today's Date: 12/07/2012 Time: 0865-7846 OT Time Calculation (min): 30 min  OT Assessment / Plan / Recommendation Clinical Impression  79 yo male admitted due to slurred speech and MRI reveals Lt temporal posterior infarct at this time. OT to sign off acutely. Recommend outpatient and SLP . Pt demonstrates word finding deficits    OT Assessment  Patient needs continued OT Services    Follow Up Recommendations  Outpatient OT    Barriers to Discharge      Equipment Recommendations       Recommendations for Other Services    Frequency       Precautions / Restrictions Precautions Precautions: Fall Restrictions Weight Bearing Restrictions: No   Pertinent Vitals/Pain     ADL  Grooming: Wash/dry hands;Wash/dry face;Teeth care;Brushing hair;Modified independent Where Assessed - Grooming: Unsupported standing Lower Body Dressing: Modified independent Where Assessed - Lower Body Dressing: Unsupported sitting Toilet Transfer: Supervision/safety Toilet Transfer Method: Sit to Barista: Regular height toilet Equipment Used: Gait belt Transfers/Ambulation Related to ADLs: Pt completed the DGi and indicates a fall risk due to balance deficits . pt walking with decr gait velocity and guarded due to self recogntiion of balance deficits ADL Comments: Pt will have wife (A) at d/c home.pt could benefit from high balance therapy at outpatient. Pt is a fall risk in the community ambulating. Pt is unsafe to drive at this time due to delayed response time.     OT Diagnosis:    OT Problem List:   OT Treatment Interventions:     OT Goals    Visit Information  Last OT Received On: 12/07/12 Assistance Needed: +1 PT/OT Co-Evaluation/Treatment: Yes    Subjective Data  Subjective: "comb "- pt required increased time and cueing to produce the term "comb " as a need  in the bathroom Patient Stated Goal: to return home today- very anxious to leave   Prior Functioning     Home Living Lives With: Spouse Available Help at Discharge: Family;Available 24 hours/day Type of Home:  (town home) Home Access: Stairs to enter Secretary/administrator of Steps: 1 Entrance Stairs-Rails: None Home Layout: One level Bathroom Shower/Tub: Health visitor: Handicapped height Home Adaptive Equipment: Built-in shower seat Additional Comments: wife self reports falling x5 times on pavement striking forehead each time. Wife reports not talking to doctor about it and that patient is much more stable on his feet than she is normally. daughter present in room and states that patient's wife has difficulty with depth with change surface heights.  Prior Function Level of Independence: Independent Able to Take Stairs?: Yes Driving: Yes Vocation: Retired Musician: Expressive difficulties (word finding difficulties) Dominant Hand: Right         Vision/Perception Vision - History Baseline Vision: Wears glasses all the time Patient Visual Report: No change from baseline   Cognition  Cognition Overall Cognitive Status: Impaired Area of Impairment: Awareness of deficits Arousal/Alertness: Awake/alert Orientation Level: Oriented X4 / Intact (with increased time due to word finding diffiulties) Behavior During Session: [facility] for tasks performed Awareness of Deficits: decreased insight Cognition - Other Comments: delayed response to questioning due to word finding deficits and delayed processing of questioned asked. Pt is able to repeat the question back to therapist and laughs.     Extremity/Trunk Assessment Right Upper Extremity Assessment RUE ROM/Strength/Tone: WFL for tasks assessed RUE Sensation: WFL - Light Touch Left Upper Extremity Assessment  LUE ROM/Strength/Tone: WFL for tasks assessed LUE Sensation: WFL - Light Touch Right  Lower Extremity Assessment RLE ROM/Strength/Tone: WFL for tasks assessed RLE Sensation: WFL - Light Touch Left Lower Extremity Assessment LLE ROM/Strength/Tone: WFL for tasks assessed LLE Sensation: WFL - Light Touch Trunk Assessment Trunk Assessment: Kyphotic     Mobility Bed Mobility Bed Mobility: Not assessed Transfers Sit to Stand: 4: Min guard;With upper extremity assist;From bed Stand to Sit: 4: Min guard;With upper extremity assist;To chair/3-in-1 Details for Transfer Assistance: increaed time, guarded mvmt     Exercise     Balance Standardized Balance Assessment Standardized Balance Assessment: Dynamic Gait Index Dynamic Gait Index Level Surface: Mild Impairment Change in Gait Speed: Mild Impairment Gait with Horizontal Head Turns: Mild Impairment Gait with Vertical Head Turns: Mild Impairment Gait and Pivot Turn: Mild Impairment Step Over Obstacle: Moderate Impairment Step Around Obstacles: Mild Impairment Steps: Mild Impairment Total Score: 15 High Level Balance High Level Balance Activites: Turns;Direction changes;Sudden stops;Head turns   End of Session OT - End of Session Activity Tolerance: Patient tolerated treatment well Patient left: in chair;with call bell/phone within reach;with family/visitor present Nurse Communication: Mobility status;Precautions  GO Functional Assessment Tool Used: clinical judgement Functional Limitation: Self care Self Care Current Status (B1478): At least 1 percent but less than 20 percent impaired, limited or restricted Self Care Goal Status (G9562): At least 1 percent but less than 20 percent impaired, limited or restricted Self Care Discharge Status [phone]): At least 1 percent but less than 20 percent impaired, limited or restricted   Lucile Shutters 12/07/2012, 10:44 AM Pager: [phone]

## 2012-12-07 NOTE — Plan of Care (Signed)
Problem: Phase II Progression Outcomes Goal: Able to communicate Outcome: Progressing Demonstrates word finding deficits throughout OT evaluation. Pt self reports having deficits finding words. Pt repeating statements due to inability to form new words to answer

## 2012-12-07 NOTE — Evaluation (Signed)
Speech Language Pathology Evaluation Patient Details Name: Caleb Delgado MRN: [id number] DOB: 03-Jun-1926 Today's Date: 12/07/2012 Time: 1478-2956 SLP Time Calculation (min): 38 min  Problem List:  Patient Active Problem List  Diagnosis  . Expressive aphasia  . HTN (hypertension)  . Other and unspecified hyperlipidemia  . BPH (benign prostatic hyperplasia)  . history of TIA  . S/P AVR (aortic valve replacement)  . Unspecified sinusitis (chronic)   Past Medical History:  Past Medical History  Diagnosis Date  . Stroke     TIA   Past Surgical History:  Past Surgical History  Procedure Laterality Date  . Cardiac surgery    . Aortic valve repair    . Lung surgery     HPI:  Caleb Delgado is an 79 y.o. male who reports awakening 12/06/12 normal. The patient went to church but did not have an opportunity to speak until after church when he went to lunch and was attempting to order. He was unable to get his words out correctly. Speech was garbled. Speech did not improve. Patient went home then soon presented to the ED for further evaluation. Speech did improve not long after presentation but has not returned to baseline.     Assessment / Plan / Recommendation Clinical Impression  Patient presents with a mild-moderate expressive aphasia, characterized by dysnomia.  Patient responds well to phonemic cues.      SLP Assessment  Patient needs continued Speech Lanaguage Pathology Services    Follow Up Recommendations  Outpatient SLP;24 hour supervision/assistance    Frequency and Duration min 2x/week  2 weeks   Pertinent Vitals/Pain n/a   SLP Goals  SLP Goals Potential to Achieve Goals: Good Progress/Goals/Alternative treatment plan discussed with pt/caregiver and they: Agree SLP Goal #1: Patient will participate in various naming tasks with min. cues. SLP Goal #2: Patient will utilize compensatory strategies to complete his thought when experiencing dysnomia in  conversation.  SLP Evaluation Prior Functioning  Cognitive/Linguistic Baseline: Baseline deficits Baseline deficit details: Wife reports patient had some "memory" and word finding deficits prior to this CVA. 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.)   Cognition  Overall Cognitive Status: Appears within functional limits for tasks assessed Arousal/Alertness: Awake/alert Orientation Level: Oriented X4 Attention:  (WFL) Memory: Appears intact Awareness: Appears intact Problem Solving: Appears intact Safety/Judgment: Appears intact    Comprehension  Auditory Comprehension Overall Auditory Comprehension: Appears within functional limits for tasks assessed Yes/No Questions: Within Functional Limits Commands: Within Functional Limits Conversation: Simple EffectiveTechniques: Extra processing time Visual Recognition/Discrimination Discrimination: Not tested Reading Comprehension Reading Status: Not tested    Expression Expression Primary Mode of Expression: Verbal Verbal Expression Overall Verbal Expression: Impaired Initiation: No impairment Level of Generative/Spontaneous Verbalization: Sentence Repetition: No impairment Naming: Impairment Responsive: 76-100% accurate Confrontation: Impaired Convergent: 75-100% accurate Divergent: 75-100% accurate Verbal Errors: Perseveration Pragmatics: No impairment Effective Techniques: Semantic cues;Sentence completion;Articulatory cues Non-Verbal Means of Communication: Not applicable Written Expression Dominant Hand: Right Written Expression: Not tested   Oral / Motor Oral Motor/Sensory Function Overall Oral Motor/Sensory Function: Appears within functional limits for tasks assessed Motor Speech Overall Motor Speech: Appears within functional limits for tasks  assessed Respiration: Within functional limits Phonation: Normal Resonance: Within functional limits Articulation: Within functional limitis Intelligibility: Intelligible Motor Planning: Witnin functional limits Motor Speech Errors: Not applicable   GO Functional Assessment Tool  Used: Informal speech/language assessment; Skilled observation Functional Limitations: Spoken language expressive Spoken Language Expression Current Status [phone]): At least 20 percent but less than 40 percent impaired, limited or restricted Spoken Language Expression Discharge Status [phone]): At least 1 percent but less than 20 percent impaired, limited or restricted   Maryjo Rochester T 12/07/2012, 3:51 PM

## 2012-12-07 NOTE — Evaluation (Signed)
Physical Therapy Evaluation Patient Details Name: Caleb Delgado MRN: [id number] DOB: [birth year] Today's Date: 12/07/2012 Time: 4098-1191 PT Time Calculation (min): 28 min  PT Assessment / Plan / Recommendation Clinical Impression  Pt with small acute infacrt to post L temporal lobe presenting with expressive aphasia, word finding difficulties, delayed processing/increased response time and mild balance impairment noted by a score of 15 on the DGI. Pt with good home set up and support. Anticipate pt to be safe for d/c home with assist of spouse. Acute PT to follow to address balance impairment.    PT Assessment  Patient needs continued PT services    Follow Up Recommendations  Supervision/Assistance - 24 hour;No PT follow up    Does the patient have the potential to tolerate intense rehabilitation      Barriers to Discharge None      Equipment Recommendations  None recommended by PT    Recommendations for Other Services Speech consult   Frequency Min 3X/week    Precautions / Restrictions Precautions Precautions: Fall Restrictions Weight Bearing Restrictions: No   Pertinent Vitals/Pain Denies pain      Mobility  Bed Mobility Bed Mobility: Not assessed Transfers Transfers: Sit to Stand;Stand to Sit Sit to Stand: 4: Min guard;With upper extremity assist;From bed Stand to Sit: 4: Min guard;With upper extremity assist;To chair/3-in-1 Details for Transfer Assistance: increaed time, guarded mvmt Ambulation/Gait Ambulation/Gait Assistance: 4: Min guard Ambulation Distance (Feet): 200 Feet Assistive device: None Ambulation/Gait Assistance Details: pt mildly unsteady but no obvious episodes of LOB Gait Pattern: Within Functional Limits Gait velocity: pt guarded/cautious Stairs: Yes Stairs Assistance: 4: Min guard Stair Management Technique: One rail Left Number of Stairs: 4 (limited by IV line) Wheelchair Mobility Wheelchair Mobility: No Modified Rankin (Stroke  Patients Only) Pre-Morbid Rankin Score: No symptoms Modified Rankin: No significant disability    Exercises     PT Diagnosis: Difficulty walking  PT Problem List: Decreased strength;Decreased balance PT Treatment Interventions: Balance training;Functional mobility training;Stair training;Gait training   PT Goals Acute Rehab PT Goals PT Goal Formulation: With patient Time For Goal Achievement: 12/14/12 Potential to Achieve Goals: Good Pt will go Supine/Side to Sit: with modified independence;with HOB 0 degrees PT Goal: Supine/Side to Sit - Progress: Goal set today Pt will go Sit to Supine/Side: with modified independence;with HOB 0 degrees PT Goal: Sit to Supine/Side - Progress: Goal set today Pt will go Sit to Stand: with modified independence;with upper extremity assist PT Goal: Sit to Stand - Progress: Goal set today Pt will Ambulate: >150 feet;with modified independence (no device) PT Goal: Ambulate - Progress: Goal set today Pt will Go Up / Down Stairs: Flight;with modified independence;with rail(s) PT Goal: Up/Down Stairs - Progress: Goal set today Additional Goals Additional Goal #1: Pt to score >19 on DGI to indicated decreased falls risk. PT Goal: Additional Goal #1 - Progress: Goal set today  Visit Information  Last PT Received On: 12/07/12 Assistance Needed: +1 PT/OT Co-Evaluation/Treatment: Yes    Subjective Data  Subjective: Pt received sititng EOB with request to use bathroom.   Prior Functioning  Home Living Lives With: Spouse Available Help at Discharge: Family;Available 24 hours/day Type of Home:  (town home) Home Access: Stairs to enter Secretary/administrator of Steps: 1 Entrance Stairs-Rails: None Home Layout: One level Bathroom Shower/Tub: Health visitor: Handicapped height Home Adaptive Equipment: Built-in shower seat Prior Function Level of Independence: Independent Able to Take Stairs?: Yes Driving: Yes Vocation:  Retired Musician: Expressive difficulties (word  finding difficulties) Dominant Hand: Right    Cognition  Cognition Overall Cognitive Status: Impaired Area of Impairment: Awareness of deficits Arousal/Alertness: Awake/alert Orientation Level: Oriented X4 / Intact (with increased time due to word finding diffiulties) Behavior During Session: [facility] for tasks performed Awareness of Deficits: decreased insight    Extremity/Trunk Assessment Right Upper Extremity Assessment RUE ROM/Strength/Tone: WFL for tasks assessed RUE Sensation: WFL - Light Touch Left Upper Extremity Assessment LUE ROM/Strength/Tone: WFL for tasks assessed LUE Sensation: WFL - Light Touch Right Lower Extremity Assessment RLE ROM/Strength/Tone: WFL for tasks assessed RLE Sensation: WFL - Light Touch Left Lower Extremity Assessment LLE ROM/Strength/Tone: WFL for tasks assessed LLE Sensation: WFL - Light Touch Trunk Assessment Trunk Assessment: Kyphotic   Balance Standardized Balance Assessment Standardized Balance Assessment: Dynamic Gait Index Dynamic Gait Index Level Surface: Mild Impairment Change in Gait Speed: Mild Impairment Gait with Horizontal Head Turns: Mild Impairment Gait with Vertical Head Turns: Mild Impairment Gait and Pivot Turn: Mild Impairment Step Over Obstacle: Moderate Impairment Step Around Obstacles: Mild Impairment Steps: Mild Impairment Total Score: 15  End of Session PT - End of Session Equipment Utilized During Treatment: Gait belt Activity Tolerance: Patient tolerated treatment well Patient left: in chair;with call bell/phone within reach;with family/visitor present Nurse Communication: Mobility status  GP Functional Assessment Tool Used: clinical judgement Functional Limitation: Mobility: Walking and moving around Mobility: Walking and Moving Around Current Status [phone]): At least 1 percent but less than 20 percent impaired, limited or restricted Mobility:  Walking and Moving Around Goal Status [phone]): 0 percent impaired, limited or restricted   Marcene Brawn 12/07/2012, 10:27 AM  Lewis Shock, PT, DPT Pager #: [phone] Office #: [phone]

## 2012-12-07 NOTE — Progress Notes (Signed)
*  PRELIMINARY RESULTS* Vascular Ultrasound Carotid Duplex (Doppler) has been completed.   The right internal carotid artery demonstrates elevated velocities suggestive of stenosis <40%. The left internal carotid artery exhibits elevated velocities with peak systolic velocity suggesting 60-79% stenosis (234 cm/s) and end diastolic velocity suggesting <40% stenosis. Vertebral arteries are patent with antegrade flow.   12/07/2012 2:12 PM Gertie Fey, RDMS, RDCS

## 2012-12-07 NOTE — Progress Notes (Signed)
Stroke Team Progress Note  HISTORY  Caleb Delgado is an 79 y.o. male who reports awakening 12/06/12 normal. The patient went to church but did not have an opportunity to speak until after church when he went to lunch and was attempting to order. He was unable to get his words out correctly. Speech was garbled. Speech did not improve. Patient went home then soon presented to the ED for further evaluation. Speech did improve not long after presentation but has not returned to baseline.  Patient does report feeling poorly yesterday. Reports feeling weak and dizzy. This improved by 10AM and patient was normal when he went to bed last evening. Does not report recurrence of these symptoms today.   LSN: [id number] 12/06/12 tPA Given: No: Outside time window  SUBJECTIVE The patient's wife and daughter are in his room this morning. The patient and his family feel that he has improved significantly although he does still have some word finding difficulties. We discussed the results of the MRI and all of their questions were answered.   OBJECTIVE Most recent Vital Signs: Filed Vitals:   12/06/12 1905 12/06/12 2126 12/07/12 0147 12/07/12 0601  BP: 158/59 130/52 118/50 130/58  Pulse: 58 57 52 51  Temp: 98 F (36.7 C) 97.5 F (36.4 C) 98 F (36.7 C) 97.9 F (36.6 C)  TempSrc: Oral Oral Oral Oral  Resp: 16 18 20 20   Height:      Weight: 78.744 kg (173 lb 9.6 oz)     SpO2: 98% 99% 97% 98%   CBG (last 3)   Recent Labs  12/06/12 1414  GLUCAP 133*    IV Fluid Intake:   . sodium chloride 75 mL/hr at 12/06/12 1902    MEDICATIONS  . cefUROXime  250 mg Oral BID WC  . clopidogrel  75 mg Oral Q breakfast  . enoxaparin (LOVENOX) injection  40 mg Subcutaneous Q24H  . fluticasone  1 spray Each Nare Daily  . loratadine  10 mg Oral Daily  . LORazepam  0.5 mg Oral Q8H  . mesalamine  1,200 mg Oral Q breakfast  . simvastatin  40 mg Oral q1800  . tamsulosin  0.4 mg Oral QPC supper   PRN:   acetaminophen, ondansetron  Diet:  Cardiac with thin liquids Activity:   Bathroom privileges with assistance DVT Prophylaxis:  Lovenox  CLINICALLY SIGNIFICANT STUDIES Basic Metabolic Panel:  Recent Labs Lab 12/06/12 1355 12/06/12 1741  NA 141  --   K 3.9  --   CL 101  --   CO2 28  --   GLUCOSE 124*  --   BUN 19  --   CREATININE 1.30 1.21  CALCIUM 9.8  --    Liver Function Tests:  Recent Labs Lab 12/06/12 1355  AST 25  ALT 22  ALKPHOS 74  BILITOT 0.8  PROT 7.8  ALBUMIN 4.2   CBC:  Recent Labs Lab 12/06/12 1355 12/06/12 1741  WBC 9.2 8.6  NEUTROABS 6.0  --   HGB 12.6* 12.3*  HCT 36.9* 35.4*  MCV 94.4 92.2  PLT 160 150   Coagulation:  Recent Labs Lab 12/06/12 1355  LABPROT 13.5  INR 1.04   Cardiac Enzymes:  Recent Labs Lab 12/06/12 1355  TROPONINI <0.30   Urinalysis:  Recent Labs Lab 12/06/12 1540  COLORURINE YELLOW  LABSPEC 1.012  PHURINE 6.0  GLUCOSEU NEGATIVE  HGBUR NEGATIVE  BILIRUBINUR NEGATIVE  KETONESUR NEGATIVE  PROTEINUR NEGATIVE  UROBILINOGEN 0.2  NITRITE NEGATIVE  LEUKOCYTESUR NEGATIVE  Lipid Panel No results found for this basename: chol, trig, hdl, cholhdl, vldl, ldlcalc   HgbA1C  Lab Results  Component Value Date   HGBA1C 6.1* 12/06/2012    Urine Drug Screen:   No results found for this basename: labopia, cocainscrnur, labbenz, amphetmu, thcu, labbarb    Alcohol Level:  Recent Labs Lab 12/06/12 1355  ETH <11    Ct Head Wo Contrast 12/06/12 IMPRESSION: 1.  No acute intracranial process. 2.  Mild, likely age appropriate, atrophy and microvascular ischemic disease. 3.  Air-fluid level with the right maxillary sinus, nonspecific but may be seen in the setting of acute sinusitis.    Mri Brain Without Contrast 12/06/12 IMPRESSION:  Small acute infarct posterior left temporal lobe (posterior left  opercular region) and left periatrial region.   Mr Maxine Glenn Head/brain Wo Cm  IMPRESSION:  Decreased number visualized  left middle cerebral artery branches  consistent with the patient's acute infarct.  Moderate narrowing left vertebral artery.  Nonvisualization left PICA.   2D Echocardiogram  pending  Carotid Doppler  pending   EKG sinus rhythm rate 61 beats per minute  Therapy Recommendations pending  Physical Exam  General - this is a pleasant 80 year old male seated in a chair in no acute distress. Heart - Regular rate and rhythm - no murmer noted Lungs - Clear to auscultation Abdomen - Soft - non tender Extremities - Distal pulses intact - no edema Skin - Warm and dry  NEUROLOGIC:   MENTAL STATUS: awake, alert, speech fluent. No paraphasic errors. No dysrathria. CRANIAL NERVES: pupils equal and reactive to light, visual fields full to confrontation, extraocular muscles intact,  facial sensation and strength symmetric,   MOTOR: normal bulk and tone, full strength in the BUE, BLE  strength 5 over 5 throughout SENSORY: normal and symmetric to light touch  COORDINATION: finger-nose-finger normal    ASSESSMENT Mr. Caleb Delgado is a 79 y.o. male presenting with aphasia. T-PA was not given secondary to late arrival. Imaging confirms a small acute infarct posterior left temporal lobe (posterior left opercular region) and left periatrial region.  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  On aspirin 325 mg orally every day prior to admission. Now on clopidogrel 75 mg orally every day for secondary stroke prevention. Patient with resultant  Mild expressive aphasia. Work up underway.   Previous stroke (TIA?)  History of aortic valve repair  Hypertension  Hemoglobin A1c 6.1  Lipids pending - on Zocor prior to admission  Hospital day # 1  TREATMENT/PLAN  Continue clopidogrel 75 mg orally every day for secondary stroke prevention.  Await lipid profile  Await 2-D echo and carotid Dopplers  Await therapist evaluation  Spoke with Dr Pearlean Brownie - order TEE [facility] notified.  Delton See PA-C [facility] Pager [phone] 12/07/2012, 8:36 AM  I have personally obtained a history, examined the patient, evaluated imaging results, and formulated the assessment and plan of care. I agree with the above.  Delia Heady, MD Medical Director [facility] Pager: [phone] 12/07/2012 1:18 PM

## 2012-12-08 ENCOUNTER — Encounter (HOSPITAL_COMMUNITY): Payer: Self-pay | Admitting: *Deleted

## 2012-12-08 ENCOUNTER — Encounter (HOSPITAL_COMMUNITY)
Admission: EM | Disposition: A | Discharge status: Home / Self Care | Payer: Self-pay | Source: Home / Self Care | Attending: Family Medicine

## 2012-12-08 DIAGNOSIS — I6789 Other cerebrovascular disease: Secondary | ICD-10-CM

## 2012-12-08 HISTORY — PX: TEE WITHOUT CARDIOVERSION: SHX5443

## 2012-12-08 SURGERY — ECHOCARDIOGRAM, TRANSESOPHAGEAL
Anesthesia: Moderate Sedation

## 2012-12-08 MED ORDER — CEFUROXIME AXETIL 250 MG PO TABS: 250.0000 mg | ORAL_TABLET | Freq: Two times a day (BID) | ORAL | Status: AC

## 2012-12-08 MED ORDER — RIVAROXABAN 20 MG PO TABS: 20.0000 mg | ORAL_TABLET | Freq: Every day | ORAL | Status: DC

## 2012-12-08 MED ORDER — CLOPIDOGREL BISULFATE 75 MG PO TABS: 75.0000 mg | ORAL_TABLET | Freq: Every day | ORAL | Status: DC

## 2012-12-08 MED ORDER — FENTANYL CITRATE 0.05 MG/ML IJ SOLN
INTRAMUSCULAR | Status: DC | PRN
  Administered 2012-12-08: 25 ug via INTRAVENOUS

## 2012-12-08 MED ORDER — MIDAZOLAM HCL 10 MG/2ML IJ SOLN
INTRAMUSCULAR | Status: DC | PRN
  Administered 2012-12-08 (×2): 2 mg via INTRAVENOUS

## 2012-12-08 MED ORDER — BUTAMBEN-TETRACAINE-BENZOCAINE 2-2-14 % EX AERO
INHALATION_SPRAY | CUTANEOUS | Status: DC | PRN
  Administered 2012-12-08: 2 via TOPICAL

## 2012-12-08 MED ORDER — SODIUM CHLORIDE 0.9 % IV SOLN
INTRAVENOUS | Status: DC
  Administered 2012-12-08: 500 mL via INTRAVENOUS

## 2012-12-08 NOTE — Interval H&P Note (Signed)
History and Physical Interval Note:  12/08/2012 3:21 PM  Caleb Delgado  has presented today for surgery, with the diagnosis of stroke  The various methods of treatment have been discussed with the patient and family. After consideration of risks, benefits and other options for treatment, the patient has consented to  Procedure(s): TRANSESOPHAGEAL ECHOCARDIOGRAM (TEE) (N/A) as a surgical intervention .  The patient's history has been reviewed, patient examined, no change in status, stable for surgery.  I have reviewed the patient's chart and labs.  Questions were answered to the patient's satisfaction.     Olga Millers

## 2012-12-08 NOTE — Care Management Note (Signed)
    Page 1 of 1   12/08/2012     11:28:07 AM   CARE MANAGEMENT NOTE 12/08/2012  Patient:  Caleb Delgado, Caleb Delgado   Account Number:  [id number]  Date Initiated:  12/07/2012  Documentation initiated by:  [facility]  Subjective/Objective Assessment:   admitted with TIA     Action/Plan:   PT/OT/ST  evals-recommending outpatient OT and ST   Anticipated DC Date:  12/08/2012   Anticipated DC Plan:  HOME/SELF CARE      DC Planning Services  CM consult      Choice offered to / List presented to:             Status of service:  Completed, signed off Medicare Important Message given?   (If response is "NO", the following Medicare IM given date fields will be blank) Date Medicare IM given:   Date Additional Medicare IM given:    Discharge Disposition:  HOME/SELF CARE  Per UR Regulation:  Reviewed for med. necessity/level of care/duration of stay  If discussed at Long Length of Stay Meetings, dates discussed:    Comments:  12/08/12 Spoke with patient about outpatient therapy for OT and ST. Patient refused outpatient therapy, stating that he does not need ant therapy. Dr. Sharl Ma notified that patiert refused outpatient therapy. Jacquelynn Cree RN, BSN, CCM

## 2012-12-08 NOTE — Progress Notes (Signed)
Echocardiogram Echocardiogram Transesophageal has been performed.  BROWN, CALEBA 12/08/2012, 4:07 PM

## 2012-12-08 NOTE — Progress Notes (Signed)
PT Cancellation Note  Patient Details Name: Zailen Albarran MRN: [id number] DOB: June 01, 1926   Cancelled Treatment:    Reason Eval/Treat Not Completed: Patient at procedure or test/unavailable Attempted x 2 (1430 and 1610) PT to return as able.   Marcene Brawn 12/08/2012, 4:28 PM  Lewis Shock, PT, DPT Pager #: [phone] Office #: [phone]

## 2012-12-08 NOTE — H&P (View-Only) (Signed)
[facility] PROGRESS NOTE  Caleb Delgado MRN:[id number] DOB: 01/22/1926 DOA: 12/06/2012 PCP: KALISH, MICHAEL J, MD  Assessment/Plan: 1. CVA-MRI shows  Small acute infarct posterior left temporal lobe (posterior left  opercular region) and left periatrial region.  Patient is currently on aspirin, carotid dopplers are negative, TEE has been ordered for am.  2.Hypertension- BP has been low, antihypertensives are on hold.  3.Hyperlipidemia- continue zocor  4. BPH- continue with flomax.   Code Status: Full code Family Communication: Discussed with patient's wife at bedside Disposition Plan: To be deternmined   Consultants:  Neurology  Procedures:  Carotid dopplers  2 d echo  Antibiotics:  None  HPI/Subjective: Patient admitted with slurred speech and expressive aphasia, though he has improved he still has some expressive aphasia.  Objective: Filed Vitals:   12/07/12 0147 12/07/12 0601 12/07/12 1113 12/07/12 1425  BP: 118/50 130/58 122/52 107/53  Pulse: 52 51 54 56  Temp: 98 F (36.7 C) 97.9 F (36.6 C) 98 F (36.7 C) 97.8 F (36.6 C)  TempSrc: Oral Oral    Resp: 20 20 16 16  Height:      Weight:      SpO2: 97% 98% 100% 98%    Intake/Output Summary (Last 24 hours) at 12/07/12 1512 Last data filed at 12/07/12 0815  Gross per 24 hour  Intake    360 ml  Output      0 ml  Net    360 ml   Filed Weights   12/06/12 1537 12/06/12 1905  Weight: 79.289 kg (174 lb 12.8 oz) 78.744 kg (173 lb 9.6 oz)    Exam:   General:  Appears in no acute disterss  Cardiovascular: s1s2 RRR  Respiratory: Clear bilaterally  Abdomen: soft, nontender  Neuro: AO x 3, moving all extremities, has some expressive aphasia   Data Reviewed: Basic Metabolic Panel:  Recent Labs Lab 12/06/12 1355 12/06/12 1741  NA 141  --   K 3.9  --   CL 101  --   CO2 28  --   GLUCOSE 124*  --   BUN 19  --   CREATININE 1.30 1.21  CALCIUM 9.8  --    Liver Function  Tests:  Recent Labs Lab 12/06/12 1355  AST 25  ALT 22  ALKPHOS 74  BILITOT 0.8  PROT 7.8  ALBUMIN 4.2   No results found for this basename: LIPASE, AMYLASE,  in the last 168 hours No results found for this basename: AMMONIA,  in the last 168 hours CBC:  Recent Labs Lab 12/06/12 1355 12/06/12 1741  WBC 9.2 8.6  NEUTROABS 6.0  --   HGB 12.6* 12.3*  HCT 36.9* 35.4*  MCV 94.4 92.2  PLT 160 150   Cardiac Enzymes:  Recent Labs Lab 12/06/12 1355  TROPONINI <0.30   BNP (last 3 results) No results found for this basename: PROBNP,  in the last 8760 hours CBG:  Recent Labs Lab 12/06/12 1414  GLUCAP 133*    No results found for this or any previous visit (from the past 240 hour(s)).   Studies: Ct Head Wo Contrast  12/06/2012  *RADIOLOGY REPORT*  Clinical Data: Expressive aphasia, remote history of TIA, Code stroke  CT HEAD WITHOUT CONTRAST  Technique:  Contiguous axial images were obtained from the base of the skull through the vertex without contrast.  Comparison: None.  Findings:  There is mild diffuse atrophy with mild prominence of bifrontal extra-axial spaces and commiserate mild ex vacuo dilatation of the   ventricular system.  Scattered periventricular hypodensities compatible microvascular ischemic disease.  There is no CT evidence of acute large territory infarct.  No intraparenchymal or extra- axial mass or hemorrhage.  Normal size and configuration of the ventricles and basilar cisterns gave an volume loss.  No midline shift.  Air-fluid level with the right maxillary sinus.  Remaining paranasal sinuses and mastoid air cells are normally aerated.  Post bilateral cataract surgery.  Regional soft tissues are normal.  No displaced calvarial fracture.  IMPRESSION: 1.  No acute intracranial process. 2.  Mild, likely age appropriate, atrophy and microvascular ischemic disease. 3.  Air-fluid level with the right maxillary sinus, nonspecific but may be seen in the setting of acute  sinusitis.  Above findings discussed with Dr. Miller at 14:16.   Original Report Authenticated By: John Watts V, MD    Mri Brain Without Contrast  12/06/2012  *RADIOLOGY REPORT*  Clinical Data:  Difficulty gathering words.  Expressive ectasia. Remote infarct.  MRI BRAIN WITHOUT CONTRAST MRA HEAD WITHOUT CONTRAST  Technique: Multiplanar, multiecho pulse sequences of the brain and surrounding structures were obtained according to standard protocol without intravenous contrast.  Angiographic images of the head were obtained using MRA technique without contrast.  Comparison: 12/06/2012 head CT.  MRI HEAD  Findings:  Small acute infarct posterior left temporal lobe (posterior left opercular region) and left periatrial region.  At the level of the posterior left frontal lobe infarct, there is a curvilinear structure which is suspect represents thrombus within the left middle cerebral artery vessel rather than hemorrhage. There is slow flow within branches distal to this region as best appreciated on FLAIR imaging.  Global atrophy without hydrocephalus.  No intracranial mass lesion detected on this unenhanced exam.  Right maxillary sinus is small mild to moderate mucosal thickening. Mild mucosal thickening ethmoid sinus air cells.  IMPRESSION: Small acute infarct posterior left temporal lobe (posterior left opercular region) and left periatrial region.  Please see above  MRA HEAD  Findings: Aplastic A1 segment right anterior cerebral artery.  The left internal carotid artery is larger than the right internal carotid artery which may be congenital although proximal right internal carotid artery stenosis not  excluded.  Decreased number visualized left middle cerebral artery branches consistent with the patient's acute infarct.  Tiny bulge superior border M1 segment right middle cerebral artery probably origin of a vessel rather than aneurysm.  Ectatic vertebral arteries and basilar artery.  Right vertebral artery is  dominant.  Moderate narrowing left vertebral artery. Nonvisualization left PICA.  No significant stenosis basilar artery.  Nonvisualization PICAs.  Mild narrowing and posterior cerebral artery distal branches more notable on the right.  IMPRESSION: Decreased number visualized left middle cerebral artery branches consistent with the patient's acute infarct.  Moderate narrowing left vertebral artery.  Nonvisualization left PICA.  Please see above.  Critical Value/emergent results were called by telephone at the time of interpretation on 12/06/2012 at 8:32 p.m. to Intha the patient's nurse, who verbally acknowledged these results.   Original Report Authenticated By: Steven Olson, M.D.    Mr Mra Head/brain Wo Cm  12/06/2012  *RADIOLOGY REPORT*  Clinical Data:  Difficulty gathering words.  Expressive ectasia. Remote infarct.  MRI BRAIN WITHOUT CONTRAST MRA HEAD WITHOUT CONTRAST  Technique: Multiplanar, multiecho pulse sequences of the brain and surrounding structures were obtained according to standard protocol without intravenous contrast.  Angiographic images of the head were obtained using MRA technique without contrast.  Comparison: 12/06/2012 head CT.  MRI HEAD    Findings:  Small acute infarct posterior left temporal lobe (posterior left opercular region) and left periatrial region.  At the level of the posterior left frontal lobe infarct, there is a curvilinear structure which is suspect represents thrombus within the left middle cerebral artery vessel rather than hemorrhage. There is slow flow within branches distal to this region as best appreciated on FLAIR imaging.  Global atrophy without hydrocephalus.  No intracranial mass lesion detected on this unenhanced exam.  Right maxillary sinus is small mild to moderate mucosal thickening. Mild mucosal thickening ethmoid sinus air cells.  IMPRESSION: Small acute infarct posterior left temporal lobe (posterior left opercular region) and left periatrial region.  Please  see above  MRA HEAD  Findings: Aplastic A1 segment right anterior cerebral artery.  The left internal carotid artery is larger than the right internal carotid artery which may be congenital although proximal right internal carotid artery stenosis not  excluded.  Decreased number visualized left middle cerebral artery branches consistent with the patient's acute infarct.  Tiny bulge superior border M1 segment right middle cerebral artery probably origin of a vessel rather than aneurysm.  Ectatic vertebral arteries and basilar artery.  Right vertebral artery is dominant.  Moderate narrowing left vertebral artery. Nonvisualization left PICA.  No significant stenosis basilar artery.  Nonvisualization PICAs.  Mild narrowing and posterior cerebral artery distal branches more notable on the right.  IMPRESSION: Decreased number visualized left middle cerebral artery branches consistent with the patient's acute infarct.  Moderate narrowing left vertebral artery.  Nonvisualization left PICA.  Please see above.  Critical Value/emergent results were called by telephone at the time of interpretation on 12/06/2012 at 8:32 p.m. to Intha the patient's nurse, who verbally acknowledged these results.   Original Report Authenticated By: Steven Olson, M.D.     Scheduled Meds: . cefUROXime  250 mg Oral BID WC  . clopidogrel  75 mg Oral Q breakfast  . enoxaparin (LOVENOX) injection  40 mg Subcutaneous Q24H  . fluticasone  1 spray Each Nare Daily  . loratadine  10 mg Oral Daily  . LORazepam  0.5 mg Oral Q8H  . mesalamine  1,200 mg Oral Q breakfast  . simvastatin  40 mg Oral q1800  . tamsulosin  0.4 mg Oral QPC supper   Continuous Infusions: . sodium chloride 75 mL/hr at 12/06/12 1902    Principal Problem:   Expressive aphasia Active Problems:   HTN (hypertension)   Other and unspecified hyperlipidemia   BPH (benign prostatic hyperplasia)   history of TIA   S/P AVR (aortic valve replacement)   Unspecified  sinusitis (chronic)    Time spent: 25 min    LAMA,GAGAN S  [facility] Pager [phone]. If 7PM-7AM, please contact night-coverage at [email or web address], password [email or web address] 12/07/2012, 3:12 PM  LOS: 1 day             

## 2012-12-08 NOTE — Progress Notes (Signed)
Stroke Team Progress Note  HISTORY Caleb Delgado is an 79 y.o. male who reports awakening 12/06/12 normal. The patient went to church but did not have an opportunity to speak until after church when he went to lunch and was attempting to order. He was unable to get his words out correctly. Speech was garbled. Speech did not improve. Patient went home then soon presented to the ED for further evaluation. Speech did improve not long after presentation but has not returned to baseline.  Patient does report feeling poorly yesterday. Reports feeling weak and dizzy. This improved by 10AM and patient was normal when he went to bed last evening. Does not report recurrence of these symptoms today.   LSN: [id number] 12/06/12 tPA Given: No: Outside time window  SUBJECTIVE Wife at bedside. Patient aggravated that his TEE is scheduled so late - he thought he would go first thing this am.  OBJECTIVE Most recent Vital Signs: Filed Vitals:   12/07/12 2100 12/08/12 0205 12/08/12 0524 12/08/12 0952  BP: 113/50 110/52 113/61 144/56  Pulse: 55 50 53 57  Temp: 98.1 F (36.7 C) 98.2 F (36.8 C) 97.7 F (36.5 C) 97.6 F (36.4 C)  TempSrc:  Oral Oral Oral  Resp: 18 18 20 18   Height:      Weight:      SpO2: 98% 97% 98% 98%   CBG (last 3)   Recent Labs  12/06/12 1414  GLUCAP 133*   IV Fluid Intake:     MEDICATIONS  . cefUROXime  250 mg Oral BID WC  . clopidogrel  75 mg Oral Q breakfast  . enoxaparin (LOVENOX) injection  40 mg Subcutaneous Q24H  . fluticasone  1 spray Each Nare Daily  . loratadine  10 mg Oral Daily  . LORazepam  0.5 mg Oral Q8H  . mesalamine  1,200 mg Oral Q breakfast  . simvastatin  40 mg Oral q1800  . tamsulosin  0.4 mg Oral QPC supper   PRN:  acetaminophen, ondansetron  Diet:  NPO Activity:   Bathroom privileges with assistance DVT Prophylaxis:  Lovenox  CLINICALLY SIGNIFICANT STUDIES Basic Metabolic Panel:   Recent Labs Lab 12/06/12 1355 12/06/12 1741  NA 141  --    K 3.9  --   CL 101  --   CO2 28  --   GLUCOSE 124*  --   BUN 19  --   CREATININE 1.30 1.21  CALCIUM 9.8  --    Liver Function Tests:   Recent Labs Lab 12/06/12 1355  AST 25  ALT 22  ALKPHOS 74  BILITOT 0.8  PROT 7.8  ALBUMIN 4.2   CBC:   Recent Labs Lab 12/06/12 1355 12/06/12 1741  WBC 9.2 8.6  NEUTROABS 6.0  --   HGB 12.6* 12.3*  HCT 36.9* 35.4*  MCV 94.4 92.2  PLT 160 150   Coagulation:   Recent Labs Lab 12/06/12 1355  LABPROT 13.5  INR 1.04   Cardiac Enzymes:   Recent Labs Lab 12/06/12 1355  TROPONINI <0.30   Urinalysis:   Recent Labs Lab 12/06/12 1540  COLORURINE YELLOW  LABSPEC 1.012  PHURINE 6.0  GLUCOSEU NEGATIVE  HGBUR NEGATIVE  BILIRUBINUR NEGATIVE  KETONESUR NEGATIVE  PROTEINUR NEGATIVE  UROBILINOGEN 0.2  NITRITE NEGATIVE  LEUKOCYTESUR NEGATIVE   Lipid Panel No results found for this basename: chol,  trig,  hdl,  cholhdl,  vldl,  ldlcalc   HgbA1C  Lab Results  Component Value Date   HGBA1C 6.1* 12/06/2012  Urine Drug Screen:   No results found for this basename: labopia,  cocainscrnur,  labbenz,  amphetmu,  thcu,  labbarb    Alcohol Level:   Recent Labs Lab 12/06/12 1355  ETH <11    Ct Head Wo Contrast 12/06/12 IMPRESSION: 1.  No acute intracranial process. 2.  Mild, likely age appropriate, atrophy and microvascular ischemic disease. 3.  Air-fluid level with the right maxillary sinus, nonspecific but may be seen in the setting of acute sinusitis.   Mri Brain Without Contrast 12/06/12 IMPRESSION:  Small acute infarct posterior left temporal lobe (posterior left  opercular region) and left periatrial region.  Mr Caleb Delgado Head/brain Wo Cm 12/06/2012  IMPRESSION:  Decreased number visualized left middle cerebral artery branches consistent with the patient's acute infarct. Moderate narrowing left vertebral artery. Nonvisualization left PICA.  2D Echocardiogram EF 55-60% with no source of embolus.   Carotid Doppler  The  right internal carotid artery demonstrates elevated velocities suggestive of stenosis <40%. The left internal carotid artery exhibits elevated velocities with peak systolic velocity suggesting 60-79% stenosis (234 cm/s) and end diastolic velocity suggesting <40% stenosis. Vertebral arteries are patent with antegrade flow.   EKG sinus rhythm rate 61 beats per minute  Therapy Recommendations OP ST & OT, no PT follow up  Physical Exam  General - this is a pleasant 79 year old male seated in a chair in no acute distress. Heart - Regular rate and rhythm - no murmer noted Lungs - Clear to auscultation Abdomen - Soft - non tender Extremities - Distal pulses intact - no edema Skin - Warm and dry  NEUROLOGIC:  MENTAL STATUS: awake, alert, speech fluent. No paraphasic errors. No dysrathria. CRANIAL NERVES: pupils equal and reactive to light, visual fields full to confrontation, extraocular muscles intact,  facial sensation and strength symmetric,   MOTOR: normal bulk and tone, full strength in the BUE, BLE  strength 5 over 5 throughout SENSORY: normal and symmetric to light touch  COORDINATION: finger-nose-finger normal  ASSESSMENT Mr. Caleb Delgado is a 79 y.o. male presenting with expressive aphasia. T-PA was not given secondary to late arrival. Imaging confirms a small left posterior temporal lobe (posterior left opercular region) infarct and a left periatrial region infarct.  Infarcts felt to be embolic secondary to unknown etiology.  On aspirin 325 mg orally every day prior to admission. Now on clopidogrel 75 mg orally every day for secondary stroke prevention. Patient with resultant  Mild expressive aphasia. Work up underway.   Previous stroke (TIA?)  History of aortic valve repair  Hypertension  Hemoglobin A1c 6.1 Hyperlipidemia, LDL ? (lipid panel canceled by RN), on statin PTA, on statin now, goal LDL < 100 L ICA stenosis, some blockage, velocities not totally clear. Recommend  followup in 6 months  Hospital day # 2  TREATMENT/PLAN  Continue clopidogrel 75 mg orally every day for secondary stroke prevention. TEE to look for embolic source. If positive for PFO (patent foramen ovale), check bilateral lower extremity venous dopplers to rule out DVT as possible source of stroke.  Please schedule outpatient telemetry monitoring to assess patient for atrial fibrillation as source of stroke. May be arranged with patient's cardiologist, or cardiologist of choice.  Follow up carotid doppler in 6 months Agree with recs for OP ST and OT  Lipid profile refused by patient. Continue statin at discharge as prior to admission.  Ok for discharge from neuro standpoint following TEE completion  SHARON BIBY, MSN, RN, ANVP-BC, ANP-BC, GNP-BC [facility]  Pager: [phone] 12/08/2012 10:24 AM  I have personally obtained a history, examined the patient, evaluated imaging results, and formulated the assessment and plan of care. I agree with the above.  Delia Heady, MD Medical Director [facility] Pager: [phone] 12/08/2012 10:10 AM

## 2012-12-08 NOTE — Discharge Summary (Signed)
Physician Discharge Summary  Caleb Delgado YNW:[id number] DOB: 05-12-1926 DOA: 12/06/2012  PCP: Sid Falcon, MD  Admit date: 12/06/2012 Discharge date: 12/08/2012  Time spent: 50 minutes  Recommendations for Outpatient Follow-up:  1. Follow PCP in 2 weeks  Discharge Diagnoses:  Principal Problem:   Expressive aphasia Active Problems:   HTN (hypertension)   Other and unspecified hyperlipidemia   BPH (benign prostatic hyperplasia)   history of TIA   S/P AVR (aortic valve replacement)   Unspecified sinusitis (chronic)   Discharge Condition: Stable  Diet recommendation: Low-salt low-fat diet  Filed Weights   12/06/12 1537 12/06/12 1905  Weight: 79.289 kg (174 lb 12.8 oz) 78.744 kg (173 lb 9.6 oz)    History of present illness:  79 year old male with prior history of TIA, hypertension, hyperlipidemia, history of aortic valve replacement presented with slurred speech and unknown today. History was obtained from the patient and his wife present in the room. Patient was in church this morning, subsequently went to a restaurant for lunch and was unable to order his food, around 12 noon. He was eventually able to eat the food but his speech was somewhat slurred. His wife got concerned and drove him to [facility] ED. EDP discussed with on-call neurologist who did not feel that patient was a candidate for thrombolytics and recommended transfer to [facility] for stroke/TIA workup   Hospital Course:   CVA Patient came with expressive aphasia, imaging confirmed small left posterior temporal lobe infarct . Left infarct. Infarcts of basic embolic. Patient's wife told that patient has a history of A. fib and was on Coumadin before and has been taken off Coumadin by his new cardiologist as patient did not like to get PT/INR checked he denies any bleeding history denies any GI bleed. As patient has A. fib and now got a stroke we will need to be on anticoagulation so he was started on xarelto, I called  and discussed with neurologist Dr. Pearlean Brownie who agreed with the plan. Patient will need a repeat carotid Dopplers in 6 months  Question proximal atrial fibrillation Patient will need to followup with his cardiologist in one week. Continue xarelto.  Hypertension Patient was started back on his Bystolic  BPH Continue tamsulosin  Sinusitis Patient was started on Ceftin 250 mg by mouth twice a day, will be continued for 4 more days at home.  Procedures: Carotid Dopplers:The right internal carotid artery demonstrates elevated velocities suggestive of stenosis <40%. The left internal carotid artery exhibits elevated velocities suggestive of stenosis 40-59%. Vertebral arteries are patent with antegrade flow.      Transesophageal echo: Did not show PFO, no thrombus noted in the heart  Consultations:  Neurology  Discharge Exam: Filed Vitals:   12/08/12 1550 12/08/12 1555 12/08/12 1600 12/08/12 1605  BP: 120/68 120/68 120/68 107/60  Pulse:      Temp:      TempSrc:      Resp: 14 18 15 9   Height:      Weight:      SpO2: 100% 100% 100% 100%    General: Appears in no acute distress Cardiovascular: S1-S2 regular Respiratory: Clear bilaterally Extend his: No edema  Discharge Instructions  Discharge Orders   Future Orders Complete By Expires     Diet - low sodium heart healthy  As directed     Discharge instructions  As directed     Comments:      Will need repeat carotid artery  Doppler in 6 months  Increase activity slowly  As directed         Medication List    STOP taking these medications       aspirin 325 MG EC tablet      TAKE these medications       cefUROXime 250 MG tablet  Commonly known as:  CEFTIN  Take 1 tablet (250 mg total) by mouth 2 (two) times daily.     xarelto 20 mg po daily         LORazepam 0.5 MG tablet  Commonly known as:  ATIVAN  Take 0.5 mg by mouth at bedtime.     mesalamine 1.2 G EC tablet  Commonly known as:  LIALDA  Take  1,200 mg by mouth daily with breakfast.     nebivolol 10 MG tablet  Commonly known as:  BYSTOLIC  Take 5 mg by mouth daily.     simvastatin 40 MG tablet  Commonly known as:  ZOCOR  Take 40 mg by mouth every evening.     tamsulosin 0.4 MG Caps  Commonly known as:  FLOMAX  Take 0.4 mg by mouth daily after supper.           Follow-up Information   Follow up with Gates Rigg, MD. Schedule an appointment as soon as possible for a visit in 2 months. (stroke clinic)    Contact information:   [address], [address] [facility] [place] [address] [phone]        The results of significant diagnostics from this hospitalization (including imaging, microbiology, ancillary and laboratory) are listed below for reference.    Significant Diagnostic Studies: Ct Head Wo Contrast  12/06/2012  *RADIOLOGY REPORT*  Clinical Data: Expressive aphasia, remote history of TIA, Code stroke  CT HEAD WITHOUT CONTRAST  Technique:  Contiguous axial images were obtained from the base of the skull through the vertex without contrast.  Comparison: None.  Findings:  There is mild diffuse atrophy with mild prominence of bifrontal extra-axial spaces and commiserate mild ex vacuo dilatation of the ventricular system.  Scattered periventricular hypodensities compatible microvascular ischemic disease.  There is no CT evidence of acute large territory infarct.  No intraparenchymal or extra- axial mass or hemorrhage.  Normal size and configuration of the ventricles and basilar cisterns gave an volume loss.  No midline shift.  Air-fluid level with the right maxillary sinus.  Remaining paranasal sinuses and mastoid air cells are normally aerated.  Post bilateral cataract surgery.  Regional soft tissues are normal.  No displaced calvarial fracture.  IMPRESSION: 1.  No acute intracranial process. 2.  Mild, likely age appropriate, atrophy and microvascular ischemic disease. 3.  Air-fluid level with  the right maxillary sinus, nonspecific but may be seen in the setting of acute sinusitis.  Above findings discussed with Dr. Hyacinth Meeker at 14:16.   Original Report Authenticated By: Tacey Ruiz, MD    Mri Brain Without Contrast  12/06/2012  *RADIOLOGY REPORT*  Clinical Data:  Difficulty gathering words.  Expressive ectasia. Remote infarct.  MRI BRAIN WITHOUT CONTRAST MRA HEAD WITHOUT CONTRAST  Technique: Multiplanar, multiecho pulse sequences of the brain and surrounding structures were obtained according to standard protocol without intravenous contrast.  Angiographic images of the head were obtained using MRA technique without contrast.  Comparison: 12/06/2012 head CT.  MRI HEAD  Findings:  Small acute infarct posterior left temporal lobe (posterior left opercular region) and left periatrial region.  At the level of the posterior left frontal lobe infarct,  there is a curvilinear structure which is suspect represents thrombus within the left middle cerebral artery vessel rather than hemorrhage. There is slow flow within branches distal to this region as best appreciated on FLAIR imaging.  Global atrophy without hydrocephalus.  No intracranial mass lesion detected on this unenhanced exam.  Right maxillary sinus is small mild to moderate mucosal thickening. Mild mucosal thickening ethmoid sinus air cells.  IMPRESSION: Small acute infarct posterior left temporal lobe (posterior left opercular region) and left periatrial region.  Please see above  MRA HEAD  Findings: Aplastic A1 segment right anterior cerebral artery.  The left internal carotid artery is larger than the right internal carotid artery which may be congenital although proximal right internal carotid artery stenosis not  excluded.  Decreased number visualized left middle cerebral artery branches consistent with the patient's acute infarct.  Tiny bulge superior border M1 segment right middle cerebral artery probably origin of a vessel rather than aneurysm.   Ectatic vertebral arteries and basilar artery.  Right vertebral artery is dominant.  Moderate narrowing left vertebral artery. Nonvisualization left PICA.  No significant stenosis basilar artery.  Nonvisualization PICAs.  Mild narrowing and posterior cerebral artery distal branches more notable on the right.  IMPRESSION: Decreased number visualized left middle cerebral artery branches consistent with the patient's acute infarct.  Moderate narrowing left vertebral artery.  Nonvisualization left PICA.  Please see above.  Critical Value/emergent results were called by telephone at the time of interpretation on 12/06/2012 at 8:32 p.m. to Intha the patient's nurse, who verbally acknowledged these results.   Original Report Authenticated By: Lacy Duverney, M.D.    Mr Mra Head/brain Wo Cm  12/06/2012  *RADIOLOGY REPORT*  Clinical Data:  Difficulty gathering words.  Expressive ectasia. Remote infarct.  MRI BRAIN WITHOUT CONTRAST MRA HEAD WITHOUT CONTRAST  Technique: Multiplanar, multiecho pulse sequences of the brain and surrounding structures were obtained according to standard protocol without intravenous contrast.  Angiographic images of the head were obtained using MRA technique without contrast.  Comparison: 12/06/2012 head CT.  MRI HEAD  Findings:  Small acute infarct posterior left temporal lobe (posterior left opercular region) and left periatrial region.  At the level of the posterior left frontal lobe infarct, there is a curvilinear structure which is suspect represents thrombus within the left middle cerebral artery vessel rather than hemorrhage. There is slow flow within branches distal to this region as best appreciated on FLAIR imaging.  Global atrophy without hydrocephalus.  No intracranial mass lesion detected on this unenhanced exam.  Right maxillary sinus is small mild to moderate mucosal thickening. Mild mucosal thickening ethmoid sinus air cells.  IMPRESSION: Small acute infarct posterior left temporal  lobe (posterior left opercular region) and left periatrial region.  Please see above  MRA HEAD  Findings: Aplastic A1 segment right anterior cerebral artery.  The left internal carotid artery is larger than the right internal carotid artery which may be congenital although proximal right internal carotid artery stenosis not  excluded.  Decreased number visualized left middle cerebral artery branches consistent with the patient's acute infarct.  Tiny bulge superior border M1 segment right middle cerebral artery probably origin of a vessel rather than aneurysm.  Ectatic vertebral arteries and basilar artery.  Right vertebral artery is dominant.  Moderate narrowing left vertebral artery. Nonvisualization left PICA.  No significant stenosis basilar artery.  Nonvisualization PICAs.  Mild narrowing and posterior cerebral artery distal branches more notable on the right.  IMPRESSION: Decreased number visualized left middle cerebral  artery branches consistent with the patient's acute infarct.  Moderate narrowing left vertebral artery.  Nonvisualization left PICA.  Please see above.  Critical Value/emergent results were called by telephone at the time of interpretation on 12/06/2012 at 8:32 p.m. to Intha the patient's nurse, who verbally acknowledged these results.   Original Report Authenticated By: Lacy Duverney, M.D.     Microbiology: No results found for this or any previous visit (from the past 240 hour(s)).   Labs: Basic Metabolic Panel:  Recent Labs Lab 12/06/12 1355 12/06/12 1741  NA 141  --   K 3.9  --   CL 101  --   CO2 28  --   GLUCOSE 124*  --   BUN 19  --   CREATININE 1.30 1.21  CALCIUM 9.8  --    Liver Function Tests:  Recent Labs Lab 12/06/12 1355  AST 25  ALT 22  ALKPHOS 74  BILITOT 0.8  PROT 7.8  ALBUMIN 4.2   No results found for this basename: LIPASE, AMYLASE,  in the last 168 hours No results found for this basename: AMMONIA,  in the last 168 hours CBC:  Recent  Labs Lab 12/06/12 1355 12/06/12 1741  WBC 9.2 8.6  NEUTROABS 6.0  --   HGB 12.6* 12.3*  HCT 36.9* 35.4*  MCV 94.4 92.2  PLT 160 150   Cardiac Enzymes:  Recent Labs Lab 12/06/12 1355  TROPONINI <0.30   BNP: BNP (last 3 results) No results found for this basename: PROBNP,  in the last 8760 hours CBG:  Recent Labs Lab 12/06/12 1414  GLUCAP 133*       Signed:  LAMA,GAGAN S  [facility] 12/08/2012, 5:04 PM

## 2012-12-08 NOTE — CV Procedure (Signed)
See full TEE report in [employer]; normal LV function; s/p tissue AVR with normal function, moderate atherosclerosis descending aorta. Olga Millers

## 2012-12-09 ENCOUNTER — Encounter (HOSPITAL_COMMUNITY): Payer: Self-pay | Admitting: Cardiology

## 2013-06-01 ENCOUNTER — Encounter: Payer: Self-pay | Admitting: *Deleted

## 2013-06-01 ENCOUNTER — Encounter: Payer: Self-pay | Admitting: Cardiology

## 2013-06-02 ENCOUNTER — Encounter: Payer: Self-pay | Admitting: Cardiology

## 2013-06-02 ENCOUNTER — Ambulatory Visit (INDEPENDENT_AMBULATORY_CARE_PROVIDER_SITE_OTHER): Payer: Medicare Other | Admitting: Cardiology

## 2013-06-02 VITALS — BP 142/80 | HR 59 | Wt 174.0 lb

## 2013-06-02 DIAGNOSIS — Z954 Presence of other heart-valve replacement: Secondary | ICD-10-CM

## 2013-06-02 DIAGNOSIS — Z952 Presence of prosthetic heart valve: Secondary | ICD-10-CM

## 2013-06-02 DIAGNOSIS — I4891 Unspecified atrial fibrillation: Secondary | ICD-10-CM

## 2013-06-02 DIAGNOSIS — I2510 Atherosclerotic heart disease of native coronary artery without angina pectoris: Secondary | ICD-10-CM

## 2013-06-02 DIAGNOSIS — E785 Hyperlipidemia, unspecified: Secondary | ICD-10-CM

## 2013-06-02 DIAGNOSIS — I10 Essential (primary) hypertension: Secondary | ICD-10-CM

## 2013-06-02 MED ORDER — WARFARIN SODIUM 5 MG PO TABS: ORAL_TABLET | ORAL | Status: DC

## 2013-06-02 NOTE — Assessment & Plan Note (Signed)
Patient remains in sinus rhythm. Continue beta blocker. Given history of atrial fibrillation and CVA he will need anticoagulation lifelong. Given he has a prosthetic aortic valve we will discontinue xeralto (not recommended with valvular heart disease) and begin Coumadin 5 mg alternating with 2.5 mg daily. We will check his INR on September 2. Our goal will be 2-3.

## 2013-06-02 NOTE — Progress Notes (Signed)
HPI: 79 yo male for evaluation of AVR. Also with h/o atrial fibrillation. Patient is status post aortic valve replacement as well as coronary artery bypassing graft (SVG to RCA) for aortic stenosis in 2008. Patient had a CVA in March of 2014. Echo 3/14 showed EF 55-60, grade 2 diastolic dysfunction. TEE 3/14 showed normal LV function, prosthetic aortic valve, moderate MR, mild LAE. Carotid dopplers 3/14 showed < 40 RICA and 40-59 LICA. Patient placed on xeralto at that time. Patient states his cardiologist in [facility] recently retired and would prefer to be seen here. He does not have dyspnea on exertion, orthopnea, PND, pedal edema, chest pain or syncope.  Current Outpatient Prescriptions  Medication Sig Dispense Refill  . gabapentin (NEURONTIN) 300 MG capsule Take 1 capsule by mouth 2 (two) times daily.      Marland Kitchen LORazepam (ATIVAN) 0.5 MG tablet Take 0.5 mg by mouth at bedtime.      . mesalamine (LIALDA) 1.2 G EC tablet Take 1,200 mg by mouth daily with breakfast.      . nebivolol (BYSTOLIC) 10 MG tablet Take 5 mg by mouth daily.       . simvastatin (ZOCOR) 40 MG tablet Take 40 mg by mouth every evening.      . tamsulosin (FLOMAX) 0.4 MG CAPS Take 0.4 mg by mouth daily after supper.       No current facility-administered medications for this visit.    No Known Allergies  Past Medical History  Diagnosis Date  . Stroke     TIA  . Expressive aphasia   . HTN (hypertension)   . Other and unspecified hyperlipidemia   . BPH (benign prostatic hyperplasia)   . S/P AVR (aortic valve replacement)   . Unspecified sinusitis (chronic)   . CAD (coronary artery disease)   . Atrial fibrillation     Past Surgical History  Procedure Laterality Date  . Cardiac surgery    . Aortic valve repair    . Lung surgery    . Tee without cardioversion N/A 12/08/2012    Procedure: TRANSESOPHAGEAL ECHOCARDIOGRAM (TEE);  Surgeon: Lewayne Bunting, MD;  Location: [facility] ENDOSCOPY;  Service: Cardiovascular;   Laterality: N/A;    History   Social History  . Marital Status: Married    Spouse Name: N/A    Number of Children: 2  . Years of Education: N/A   Occupational History  . Not on file.   Social History Main Topics  . Smoking status: Former Smoker    Quit date: 10/07/1970  . Smokeless tobacco: Not on file  . Alcohol Use: Yes     Comment: Rarely  . Drug Use: No  . Sexual Activity: Not on file   Other Topics Concern  . Not on file   Social History Narrative  . No narrative on file    Family History  Problem Relation Age of Onset  . Stroke Mother   . Stroke Father     ROS: no fevers or chills, productive cough, hemoptysis, dysphasia, odynophagia, melena, hematochezia, dysuria, hematuria, rash, seizure activity, orthopnea, PND, pedal edema, claudication. Remaining systems are negative.  Physical Exam:   Blood pressure 142/80, pulse 59, weight 174 lb (78.926 kg).  General:  Well developed/well nourished in NAD Skin warm/dry Patient not depressed No peripheral clubbing Back-normal HEENT-normal/normal eyelids Neck supple/normal carotid upstroke bilaterally; no bruits; no JVD; no thyromegaly chest - CTA/ normal expansion CV - RRR/normal S1 and S2; no murmurs, rubs or gallops;  PMI nondisplaced Abdomen -  NT/ND, no HSM, no mass, + bowel sounds, no bruit 2+ femoral pulses, no bruits Ext-no edema, chords, 2+ DP Neuro-grossly nonfocal  ECG sinus rhythm at a rate of 59. First degree AV block.

## 2013-06-02 NOTE — Assessment & Plan Note (Signed)
Continue statin. Not on aspirin given need for Coumadin. 

## 2013-06-02 NOTE — Patient Instructions (Addendum)
Your physician wants you to follow-up in: 6 MONTHS WITH DR Jens Som IN [place] You will receive a reminder letter in the mail two months in advance. If you don't receive a letter, please call our office to schedule the follow-up appointment.   START WARFARIN SODIUM 5 MG TAKE ONE TABLET ALTERNATING WITH ONE HALF TABLET EVERY OTHER DAY  PROTIME CHECK Tuesday 06-09-13 AT [facility]-[id number] =TELEPHONE NUMBER [phone]

## 2013-06-02 NOTE — Assessment & Plan Note (Signed)
Continue present medications. 

## 2013-06-02 NOTE — Assessment & Plan Note (Signed)
Continue statin. 

## 2013-06-02 NOTE — Assessment & Plan Note (Signed)
Continue SBE prophylaxis. 

## 2013-06-03 ENCOUNTER — Telehealth: Payer: Self-pay | Admitting: Cardiology

## 2013-06-03 MED ORDER — NEBIVOLOL HCL 10 MG PO TABS: 5.0000 mg | ORAL_TABLET | Freq: Every day | ORAL | Status: DC

## 2013-06-03 NOTE — Telephone Encounter (Signed)
New problem   Pt need a referral fax to [facility] [address] for his coumadin checks. Please call pt.

## 2013-06-03 NOTE — Telephone Encounter (Signed)
Spoke with pt wife, she confirmed that they did receive my fax yesterday. He has been set up for coumadin checks at [facility]. Refill for bystolic given.

## 2013-08-16 ENCOUNTER — Other Ambulatory Visit: Payer: Self-pay | Admitting: *Deleted

## 2013-08-16 MED ORDER — NEBIVOLOL HCL 5 MG PO TABS: 5.0000 mg | ORAL_TABLET | Freq: Every day | ORAL | Status: DC

## 2013-10-18 ENCOUNTER — Other Ambulatory Visit: Payer: Self-pay

## 2013-10-18 MED ORDER — SIMVASTATIN 40 MG PO TABS: 40.0000 mg | ORAL_TABLET | Freq: Every evening | ORAL | Status: DC

## 2014-01-19 ENCOUNTER — Encounter: Payer: Self-pay | Admitting: Cardiology

## 2014-01-19 ENCOUNTER — Ambulatory Visit (INDEPENDENT_AMBULATORY_CARE_PROVIDER_SITE_OTHER): Payer: Medicare Other | Admitting: Cardiology

## 2014-01-19 VITALS — BP 146/82 | HR 53 | Ht 74.0 in | Wt 174.0 lb

## 2014-01-19 DIAGNOSIS — I4891 Unspecified atrial fibrillation: Secondary | ICD-10-CM

## 2014-01-19 DIAGNOSIS — I2510 Atherosclerotic heart disease of native coronary artery without angina pectoris: Secondary | ICD-10-CM

## 2014-01-19 DIAGNOSIS — I679 Cerebrovascular disease, unspecified: Secondary | ICD-10-CM

## 2014-01-19 MED ORDER — NEBIVOLOL HCL 5 MG PO TABS: 5.0000 mg | ORAL_TABLET | Freq: Every day | ORAL | Status: DC

## 2014-01-19 NOTE — Patient Instructions (Addendum)
Your physician wants you to follow-up in: 6 MONTHS WITH DR CRENSHAW You will receive a reminder letter in the mail two months in advance. If you don't receive a letter, please call our office to schedule the follow-up appointment.   Your physician has requested that you have an echocardiogram. Echocardiography is a painless test that uses sound waves to create images of your heart. It provides your doctor with information about the size and shape of your heart and how well your heart's chambers and valves are working. This procedure takes approximately one hour. There are no restrictions for this procedure.   Your physician has requested that you have a carotid duplex. This test is an ultrasound of the carotid arteries in your neck. It looks at blood flow through these arteries that supply the brain with blood. Allow one hour for this exam. There are no restrictions or special instructions.   

## 2014-01-19 NOTE — Assessment & Plan Note (Signed)
Continue statin. Schedule followup carotid Dopplers. 

## 2014-01-19 NOTE — Assessment & Plan Note (Signed)
Patient remains in sinus rhythm. Continue beta blocker and Coumadin. 

## 2014-01-19 NOTE — Assessment & Plan Note (Signed)
Blood pressure controlled. Continue present medications. 

## 2014-01-19 NOTE — Assessment & Plan Note (Signed)
Continue statin. 

## 2014-01-19 NOTE — Assessment & Plan Note (Signed)
Continue SBE prophylaxis. Repeat echocardiogram. 

## 2014-01-19 NOTE — Assessment & Plan Note (Signed)
Continue statin. Not on aspirin given need for Coumadin. 

## 2014-01-19 NOTE — Progress Notes (Signed)
      HPI: FU AVR, CAD atrial fibrillation. Patient is status post aortic valve replacement as well as coronary artery bypassing graft (SVG to RCA) for aortic stenosis in 2008. Patient had a CVA in March of 2014. Echo 3/14 showed EF 55-60, grade 2 diastolic dysfunction. TEE 3/14 showed normal LV function, prosthetic aortic valve, moderate MR, mild LAE. Carotid dopplers 3/14 showed < 40 RICA and 40-59 LICA. Since last seen, he does not have dyspnea on exertion, orthopnea, PND, pedal edema, chest pain or syncope.   Current Outpatient Prescriptions  Medication Sig Dispense Refill  . donepezil (ARICEPT) 5 MG tablet Take 1 tablet by mouth daily.      Marland Kitchen. gabapentin (NEURONTIN) 300 MG capsule Take 1 capsule by mouth 2 (two) times daily.      . nebivolol (BYSTOLIC) 5 MG tablet Take 1 tablet (5 mg total) by mouth daily.  90 tablet  1  . simvastatin (ZOCOR) 40 MG tablet Take 1 tablet (40 mg total) by mouth every evening.  30 tablet  6  . sulfaSALAzine (AZULFIDINE) 500 MG tablet Take 2 tablets by mouth daily.      . tamsulosin (FLOMAX) 0.4 MG CAPS Take 0.4 mg by mouth daily after supper.      . warfarin (COUMADIN) 5 MG tablet TAKE AS DIRECTED  30 tablet  12   No current facility-administered medications for this visit.     Past Medical History  Diagnosis Date  . Stroke     TIA  . Expressive aphasia   . HTN (hypertension)   . Other and unspecified hyperlipidemia   . BPH (benign prostatic hyperplasia)   . S/P AVR (aortic valve replacement)   . Unspecified sinusitis (chronic)   . CAD (coronary artery disease)   . Atrial fibrillation     Past Surgical History  Procedure Laterality Date  . Cardiac surgery    . Aortic valve repair    . Lung surgery    . Tee without cardioversion N/A 12/08/2012    Procedure: TRANSESOPHAGEAL ECHOCARDIOGRAM (TEE);  Surgeon: Lewayne BuntingBrian S Crenshaw, MD;  [facility];  Service: Cardiovascular;  Laterality: N/A;    History   Social History  . Marital Status:  Married    Spouse Name: N/A    Number of Children: 2  . Years of Education: N/A   Occupational History  . Not on file.   Social History Main Topics  . Smoking status: Former Smoker    Quit date: 10/07/1970  . Smokeless tobacco: Not on file  . Alcohol Use: Yes     Comment: Rarely  . Drug Use: No  . Sexual Activity: Not on file   Other Topics Concern  . Not on file   Social History Narrative  . No narrative on file    ROS: no fevers or chills, productive cough, hemoptysis, dysphasia, odynophagia, melena, hematochezia, dysuria, hematuria, rash, seizure activity, orthopnea, PND, pedal edema, claudication. Remaining systems are negative.  Physical Exam: Well-developed well-nourished in no acute distress.  Skin is warm and dry.  HEENT is normal.  Neck is supple.  Chest is clear to auscultation with normal expansion.  Cardiovascular exam is regular rate and rhythm. 2/6 systolic murmur apex Abdominal exam nontender or distended. No masses palpated. Extremities show no edema. neuro grossly intact  ECG Sinus rhythm at a rate of 53. First degree AV block. Inferior T-wave inversion.

## 2014-02-10 ENCOUNTER — Other Ambulatory Visit (HOSPITAL_COMMUNITY): Payer: Medicare Other

## 2014-02-10 ENCOUNTER — Ambulatory Visit (HOSPITAL_COMMUNITY): Payer: Medicare Other | Attending: Cardiology | Admitting: Cardiology

## 2014-02-10 ENCOUNTER — Ambulatory Visit (HOSPITAL_BASED_OUTPATIENT_CLINIC_OR_DEPARTMENT_OTHER): Payer: Medicare Other | Admitting: Radiology

## 2014-02-10 DIAGNOSIS — I359 Nonrheumatic aortic valve disorder, unspecified: Secondary | ICD-10-CM | POA: Insufficient documentation

## 2014-02-10 DIAGNOSIS — I4891 Unspecified atrial fibrillation: Secondary | ICD-10-CM

## 2014-02-10 DIAGNOSIS — I679 Cerebrovascular disease, unspecified: Secondary | ICD-10-CM

## 2014-02-10 DIAGNOSIS — I6529 Occlusion and stenosis of unspecified carotid artery: Secondary | ICD-10-CM

## 2014-02-10 NOTE — Progress Notes (Signed)
Echocardiogram performed.  

## 2014-02-10 NOTE — Progress Notes (Signed)
Carotid duplex performed 

## 2014-06-16 ENCOUNTER — Other Ambulatory Visit: Payer: Self-pay | Admitting: *Deleted

## 2014-06-16 ENCOUNTER — Telehealth: Payer: Self-pay | Admitting: Cardiology

## 2014-06-16 MED ORDER — WARFARIN SODIUM 5 MG PO TABS: ORAL_TABLET | ORAL | Status: AC

## 2014-06-16 NOTE — Telephone Encounter (Signed)
Warfarin refilled. 

## 2014-08-10 ENCOUNTER — Ambulatory Visit (INDEPENDENT_AMBULATORY_CARE_PROVIDER_SITE_OTHER): Payer: Medicare Other | Admitting: Cardiology

## 2014-08-10 ENCOUNTER — Encounter: Payer: Self-pay | Admitting: Cardiology

## 2014-08-10 VITALS — BP 118/78 | HR 54 | Ht 72.0 in | Wt 171.0 lb

## 2014-08-10 DIAGNOSIS — Z952 Presence of prosthetic heart valve: Secondary | ICD-10-CM

## 2014-08-10 DIAGNOSIS — I2583 Coronary atherosclerosis due to lipid rich plaque: Secondary | ICD-10-CM

## 2014-08-10 DIAGNOSIS — I10 Essential (primary) hypertension: Secondary | ICD-10-CM

## 2014-08-10 DIAGNOSIS — I480 Paroxysmal atrial fibrillation: Secondary | ICD-10-CM

## 2014-08-10 DIAGNOSIS — Z954 Presence of other heart-valve replacement: Secondary | ICD-10-CM

## 2014-08-10 DIAGNOSIS — I679 Cerebrovascular disease, unspecified: Secondary | ICD-10-CM

## 2014-08-10 DIAGNOSIS — I2510 Atherosclerotic heart disease of native coronary artery without angina pectoris: Secondary | ICD-10-CM

## 2014-08-10 NOTE — Assessment & Plan Note (Signed)
Continue SBE prophylaxis. 

## 2014-08-10 NOTE — Assessment & Plan Note (Signed)
Schedule follow-up carotid Dopplers May 2016.Continue statin.

## 2014-08-10 NOTE — Assessment & Plan Note (Signed)
Blood pressure controlled. Continue present medications. 

## 2014-08-10 NOTE — Patient Instructions (Signed)
Your physician wants you to follow-up in: ONE YEAR WITH DR CRENSHAW You will receive a reminder letter in the mail two months in advance. If you don't receive a letter, please call our office to schedule the follow-up appointment.  

## 2014-08-10 NOTE — Assessment & Plan Note (Signed)
Continue statin. 

## 2014-08-10 NOTE — Progress Notes (Signed)
      HPI: FU AVR, CAD and atrial fibrillation. Patient is status post aortic valve replacement as well as coronary artery bypassing graft (SVG to RCA) for aortic stenosis in 2008. Patient had a CVA in March of 2014. TEE 3/14 showed normal LV function, prosthetic aortic valve, moderate MR, mild LAE. Carotid dopplers 5/15 showed 1-39 right and 40-59 LICA. FU recommended in one year. Last echocardiogram May 2015 showed normal LV function, grade 2 diastolic dysfunction, bioprosthetic aortic valve with mean gradient 18 mmHg, mild biatrial enlargement and mild tricuspid regurgitation. Since last seen, he does not have dyspnea on exertion, orthopnea, PND, pedal edema, chest pain or syncope.  Current Outpatient Prescriptions  Medication Sig Dispense Refill  . donepezil (ARICEPT) 5 MG tablet Take 1 tablet by mouth daily.    Marland Kitchen. gabapentin (NEURONTIN) 300 MG capsule Take 1 capsule by mouth 2 (two) times daily.    . nebivolol (BYSTOLIC) 5 MG tablet Take 1 tablet (5 mg total) by mouth daily. 90 tablet 3  . simvastatin (ZOCOR) 40 MG tablet Take 1 tablet (40 mg total) by mouth every evening. 30 tablet 6  . sulfaSALAzine (AZULFIDINE) 500 MG tablet Take 2 tablets by mouth daily.    . tamsulosin (FLOMAX) 0.4 MG CAPS Take 0.4 mg by mouth daily after supper.    . warfarin (COUMADIN) 5 MG tablet TAKE AS DIRECTED 30 tablet 12   No current facility-administered medications for this visit.     Past Medical History  Diagnosis Date  . Stroke     TIA  . Expressive aphasia   . HTN (hypertension)   . Other and unspecified hyperlipidemia   . BPH (benign prostatic hyperplasia)   . S/P AVR (aortic valve replacement)   . Unspecified sinusitis (chronic)   . CAD (coronary artery disease)   . Atrial fibrillation     Past Surgical History  Procedure Laterality Date  . Cardiac surgery    . Aortic valve repair    . Lung surgery    . Tee without cardioversion N/A 12/08/2012    Procedure: TRANSESOPHAGEAL ECHOCARDIOGRAM  (TEE);  Surgeon: Lewayne BuntingBrian S Makaleigh Reinard, MD;  [facility];  Service: Cardiovascular;  Laterality: N/A;    History   Social History  . Marital Status: Married    Spouse Name: N/A    Number of Children: 2  . Years of Education: N/A   Occupational History  . Not on file.   Social History Main Topics  . Smoking status: Former Smoker    Quit date: 10/07/1970  . Smokeless tobacco: Not on file  . Alcohol Use: Yes     Comment: Rarely  . Drug Use: No  . Sexual Activity: Not on file   Other Topics Concern  . Not on file   Social History Narrative    ROS: no fevers or chills, productive cough, hemoptysis, dysphasia, odynophagia, melena, hematochezia, dysuria, hematuria, rash, seizure activity, orthopnea, PND, pedal edema, claudication. Remaining systems are negative.  Physical Exam: Well-developed frail in no acute distress.  Skin is warm and dry.  HEENT is normal.  Neck is supple.  Chest is clear to auscultation with normal expansion.  Cardiovascular exam is irregular, 2/6 systolic murmur left sternal border Abdominal exam nontender or distended. No masses palpated. Extremities show no edema. neuro grossly intact  ECG Atrial fibrillation at a rate of 54. Nonspecific ST changes.

## 2014-08-10 NOTE — Assessment & Plan Note (Signed)
Continue statin. Not on aspirin given need for Coumadin. 

## 2014-08-10 NOTE — Assessment & Plan Note (Signed)
Continue beta blocker and Coumadin. INR is monitored by primary care.

## 2014-08-16 ENCOUNTER — Other Ambulatory Visit: Payer: Self-pay | Admitting: Cardiology

## 2014-08-16 MED ORDER — SIMVASTATIN 40 MG PO TABS: 40.0000 mg | ORAL_TABLET | Freq: Every evening | ORAL | Status: DC

## 2014-08-24 ENCOUNTER — Ambulatory Visit: Payer: Medicare Other | Admitting: Cardiology

## 2014-12-16 ENCOUNTER — Other Ambulatory Visit: Payer: Self-pay | Admitting: *Deleted

## 2014-12-16 MED ORDER — NEBIVOLOL HCL 5 MG PO TABS: 5.0000 mg | ORAL_TABLET | Freq: Every day | ORAL | Status: DC

## 2015-03-20 ENCOUNTER — Other Ambulatory Visit: Payer: Self-pay

## 2015-03-20 MED ORDER — SIMVASTATIN 40 MG PO TABS: 40.0000 mg | ORAL_TABLET | Freq: Every evening | ORAL | Status: DC

## 2015-07-13 ENCOUNTER — Telehealth: Payer: Self-pay | Admitting: Cardiology

## 2015-07-13 NOTE — Telephone Encounter (Signed)
Left message for pharmacy, will need to get from PCP

## 2015-08-29 ENCOUNTER — Encounter (HOSPITAL_BASED_OUTPATIENT_CLINIC_OR_DEPARTMENT_OTHER): Payer: Self-pay

## 2015-08-29 ENCOUNTER — Emergency Department (HOSPITAL_BASED_OUTPATIENT_CLINIC_OR_DEPARTMENT_OTHER): Payer: Medicare Other

## 2015-08-29 ENCOUNTER — Observation Stay (HOSPITAL_BASED_OUTPATIENT_CLINIC_OR_DEPARTMENT_OTHER)
Admission: EM | Admit: 2015-08-29 | Discharge: 2015-08-31 | Disposition: A | Discharge status: Home Health | Payer: Medicare Other | Attending: Family Medicine | Admitting: Family Medicine

## 2015-08-29 DIAGNOSIS — Z8673 Personal history of transient ischemic attack (TIA), and cerebral infarction without residual deficits: Secondary | ICD-10-CM | POA: Diagnosis not present

## 2015-08-29 DIAGNOSIS — R531 Weakness: Secondary | ICD-10-CM | POA: Diagnosis not present

## 2015-08-29 DIAGNOSIS — R0602 Shortness of breath: Secondary | ICD-10-CM | POA: Diagnosis present

## 2015-08-29 DIAGNOSIS — Z87891 Personal history of nicotine dependence: Secondary | ICD-10-CM | POA: Diagnosis not present

## 2015-08-29 DIAGNOSIS — I4891 Unspecified atrial fibrillation: Secondary | ICD-10-CM | POA: Diagnosis present

## 2015-08-29 DIAGNOSIS — I5033 Acute on chronic diastolic (congestive) heart failure: Secondary | ICD-10-CM | POA: Diagnosis not present

## 2015-08-29 DIAGNOSIS — I480 Paroxysmal atrial fibrillation: Secondary | ICD-10-CM

## 2015-08-29 DIAGNOSIS — Z7901 Long term (current) use of anticoagulants: Secondary | ICD-10-CM | POA: Insufficient documentation

## 2015-08-29 DIAGNOSIS — Z953 Presence of xenogenic heart valve: Secondary | ICD-10-CM | POA: Diagnosis not present

## 2015-08-29 DIAGNOSIS — N400 Enlarged prostate without lower urinary tract symptoms: Secondary | ICD-10-CM | POA: Insufficient documentation

## 2015-08-29 DIAGNOSIS — I482 Chronic atrial fibrillation: Secondary | ICD-10-CM

## 2015-08-29 DIAGNOSIS — E785 Hyperlipidemia, unspecified: Secondary | ICD-10-CM | POA: Insufficient documentation

## 2015-08-29 DIAGNOSIS — F0390 Unspecified dementia without behavioral disturbance: Secondary | ICD-10-CM | POA: Insufficient documentation

## 2015-08-29 DIAGNOSIS — Z951 Presence of aortocoronary bypass graft: Secondary | ICD-10-CM | POA: Insufficient documentation

## 2015-08-29 DIAGNOSIS — K5190 Ulcerative colitis, unspecified, without complications: Secondary | ICD-10-CM | POA: Diagnosis not present

## 2015-08-29 DIAGNOSIS — I5032 Chronic diastolic (congestive) heart failure: Secondary | ICD-10-CM | POA: Diagnosis present

## 2015-08-29 DIAGNOSIS — R296 Repeated falls: Secondary | ICD-10-CM | POA: Diagnosis not present

## 2015-08-29 DIAGNOSIS — I130 Hypertensive heart and chronic kidney disease with heart failure and stage 1 through stage 4 chronic kidney disease, or unspecified chronic kidney disease: Secondary | ICD-10-CM | POA: Diagnosis not present

## 2015-08-29 DIAGNOSIS — G319 Degenerative disease of nervous system, unspecified: Secondary | ICD-10-CM | POA: Insufficient documentation

## 2015-08-29 DIAGNOSIS — G629 Polyneuropathy, unspecified: Secondary | ICD-10-CM | POA: Diagnosis not present

## 2015-08-29 DIAGNOSIS — R0600 Dyspnea, unspecified: Secondary | ICD-10-CM

## 2015-08-29 DIAGNOSIS — I2510 Atherosclerotic heart disease of native coronary artery without angina pectoris: Secondary | ICD-10-CM | POA: Diagnosis not present

## 2015-08-29 DIAGNOSIS — Z79899 Other long term (current) drug therapy: Secondary | ICD-10-CM | POA: Insufficient documentation

## 2015-08-29 DIAGNOSIS — N183 Chronic kidney disease, stage 3 unspecified: Secondary | ICD-10-CM | POA: Diagnosis present

## 2015-08-29 DIAGNOSIS — R001 Bradycardia, unspecified: Secondary | ICD-10-CM | POA: Diagnosis present

## 2015-08-29 DIAGNOSIS — I10 Essential (primary) hypertension: Secondary | ICD-10-CM | POA: Diagnosis present

## 2015-08-29 HISTORY — DX: Ulcerative colitis, unspecified, without complications: K51.90

## 2015-08-29 LAB — URINALYSIS, ROUTINE W REFLEX MICROSCOPIC
BILIRUBIN URINE: NEGATIVE
GLUCOSE, UA: NEGATIVE mg/dL
HGB URINE DIPSTICK: NEGATIVE
Ketones, ur: NEGATIVE mg/dL
Leukocytes, UA: NEGATIVE
Nitrite: NEGATIVE
PROTEIN: 30 mg/dL — AB
Specific Gravity, Urine: 1.018 (ref 1.005–1.030)
pH: 6 (ref 5.0–8.0)

## 2015-08-29 LAB — CBC
HCT: 35.8 % — ABNORMAL LOW (ref 39.0–52.0)
Hemoglobin: 11.6 g/dL — ABNORMAL LOW (ref 13.0–17.0)
MCH: 32.1 pg (ref 26.0–34.0)
MCHC: 32.4 g/dL (ref 30.0–36.0)
MCV: 99.2 fL (ref 78.0–100.0)
PLATELETS: 149 10*3/uL — AB (ref 150–400)
RBC: 3.61 MIL/uL — AB (ref 4.22–5.81)
RDW: 14.3 % (ref 11.5–15.5)
WBC: 7.2 10*3/uL (ref 4.0–10.5)

## 2015-08-29 LAB — COMPREHENSIVE METABOLIC PANEL
ALK PHOS: 69 U/L (ref 38–126)
ALT: 17 U/L (ref 17–63)
AST: 32 U/L (ref 15–41)
Albumin: 4 g/dL (ref 3.5–5.0)
Anion gap: 7 (ref 5–15)
BILIRUBIN TOTAL: 1.5 mg/dL — AB (ref 0.3–1.2)
BUN: 21 mg/dL — AB (ref 6–20)
CALCIUM: 9.4 mg/dL (ref 8.9–10.3)
CHLORIDE: 107 mmol/L (ref 101–111)
CO2: 24 mmol/L (ref 22–32)
CREATININE: 1.27 mg/dL — AB (ref 0.61–1.24)
GFR calc Af Amer: 57 mL/min — ABNORMAL LOW (ref >60)
GFR, EST NON AFRICAN AMERICAN: 49 mL/min — AB (ref >60)
Glucose, Bld: 109 mg/dL — ABNORMAL HIGH (ref 65–99)
Potassium: 4.6 mmol/L (ref 3.5–5.1)
Sodium: 138 mmol/L (ref 135–145)
TOTAL PROTEIN: 7.3 g/dL (ref 6.5–8.1)

## 2015-08-29 LAB — BRAIN NATRIURETIC PEPTIDE: B Natriuretic Peptide: 1201.6 pg/mL — ABNORMAL HIGH (ref 0.0–100.0)

## 2015-08-29 LAB — URINE MICROSCOPIC-ADD ON: WBC UA: NONE SEEN WBC/hpf (ref 0–5)

## 2015-08-29 LAB — TROPONIN I
TROPONIN I: 0.04 ng/mL — AB (ref <0.031)
Troponin I: 0.04 ng/mL — ABNORMAL HIGH (ref <0.031)

## 2015-08-29 LAB — PROTIME-INR
INR: 3.22 — ABNORMAL HIGH (ref 0.00–1.49)
Prothrombin Time: 32.3 seconds — ABNORMAL HIGH (ref 11.6–15.2)

## 2015-08-29 MED ORDER — TAMSULOSIN HCL 0.4 MG PO CAPS
0.4000 mg | ORAL_CAPSULE | Freq: Every day | ORAL | Status: DC
  Administered 2015-08-29 – 2015-08-30 (×2): 0.4 mg via ORAL
  Filled 2015-08-29 (×2): qty 1

## 2015-08-29 MED ORDER — SULFASALAZINE 500 MG PO TABS
500.0000 mg | ORAL_TABLET | Freq: Two times a day (BID) | ORAL | Status: DC
  Administered 2015-08-30 (×2): 500 mg via ORAL
  Filled 2015-08-29 (×5): qty 1

## 2015-08-29 MED ORDER — SODIUM CHLORIDE 0.9 % IJ SOLN
3.0000 mL | Freq: Two times a day (BID) | INTRAMUSCULAR | Status: DC
  Administered 2015-08-29 – 2015-08-30 (×3): 3 mL via INTRAVENOUS

## 2015-08-29 MED ORDER — FUROSEMIDE 10 MG/ML IJ SOLN
40.0000 mg | Freq: Once | INTRAMUSCULAR | Status: AC
  Administered 2015-08-29: 40 mg via INTRAVENOUS
  Filled 2015-08-29: qty 4

## 2015-08-29 MED ORDER — WARFARIN SODIUM 3 MG PO TABS
3.0000 mg | ORAL_TABLET | Freq: Once | ORAL | Status: AC
Start: 2015-08-29 — End: 2015-08-29
  Administered 2015-08-29: 3 mg via ORAL
  Filled 2015-08-29: qty 1

## 2015-08-29 MED ORDER — GABAPENTIN 300 MG PO CAPS
300.0000 mg | ORAL_CAPSULE | Freq: Every day | ORAL | Status: DC
  Administered 2015-08-29 – 2015-08-30 (×2): 300 mg via ORAL
  Filled 2015-08-29 (×2): qty 1

## 2015-08-29 MED ORDER — DONEPEZIL HCL 10 MG PO TABS
10.0000 mg | ORAL_TABLET | Freq: Every day | ORAL | Status: DC
  Administered 2015-08-29 – 2015-08-30 (×2): 10 mg via ORAL
  Filled 2015-08-29 (×2): qty 1

## 2015-08-29 MED ORDER — SIMVASTATIN 40 MG PO TABS
40.0000 mg | ORAL_TABLET | Freq: Every evening | ORAL | Status: DC
  Administered 2015-08-29 – 2015-08-30 (×2): 40 mg via ORAL
  Filled 2015-08-29 (×2): qty 1

## 2015-08-29 MED ORDER — SODIUM CHLORIDE 0.9 % IV SOLN: INTRAVENOUS | Status: DC

## 2015-08-29 MED ORDER — WARFARIN - PHARMACIST DOSING INPATIENT
Freq: Every day | Status: DC
  Administered 2015-08-30: 18:00:00

## 2015-08-29 NOTE — ED Provider Notes (Signed)
Pt visit shared with Dr. Denton LankSteinl.  Pt in slow atrial fibrillation, HR30s-50s in the ED, considerable DOE.  Family report increased generalized weakness with two recent falls.  Studies concerning for heart failure.  [facility] Excell [facility], recommends Hospitalist admission with Cardiology consult.    Tilden FossaElizabeth Latonda Larrivee, MD 08/29/15 [phone]

## 2015-08-29 NOTE — ED Provider Notes (Signed)
CSN: [id number]     Arrival date & time 08/29/15  1333 History   First MD Initiated Contact with Patient 08/29/15 1353     Chief Complaint  Patient presents with  . Palpitations     (Consider location/radiation/quality/duration/timing/severity/associated sxs/prior Treatment) The history is provided by the patient and a relative.  Patient/famil note generalized weakness, tiredness, easy to fatigue, sob, dyspnea w minimal exertion, within the past month.  Has had a couple falls in past month. Denies specific injury.  No syncope.  Pt indicates hr has been irregular for past month - hx afib, but indicates not always irregular in past. No recent change in medication, except aricept dose was changed from 5 mg to 10 mg. Pt indicates feels fine, however is poor historian.  Family indicates when falls, will be too weak to get up. Last fall was a week or two ago, spouse denies specific injury.      Past Medical History  Diagnosis Date  . Stroke [facility])     TIA  . Expressive aphasia   . HTN (hypertension)   . Other and unspecified hyperlipidemia   . BPH (benign prostatic hyperplasia)   . S/P AVR (aortic valve replacement)   . Unspecified sinusitis (chronic)   . CAD (coronary artery disease)   . Atrial fibrillation [facility])    Past Surgical History  Procedure Laterality Date  . Cardiac surgery    . Aortic valve repair    . Lung surgery    . Tee without cardioversion N/A 12/08/2012    Procedure: TRANSESOPHAGEAL ECHOCARDIOGRAM (TEE);  Surgeon: Lewayne Bunting, MD;  Location: [facility] ENDOSCOPY;  Service: Cardiovascular;  Laterality: N/A;   Family History  Problem Relation Age of Onset  . Stroke Mother   . Stroke Father    Social History  Substance Use Topics  . Smoking status: Former Smoker    Quit date: 10/07/1970  . Smokeless tobacco: None  . Alcohol Use: Yes     Comment: Rarely    Review of Systems  Constitutional: Negative for fever and chills.  HENT: Negative for sore throat.    Eyes: Negative for redness.  Respiratory: Negative for cough and shortness of breath.   Cardiovascular: Negative for chest pain.  Gastrointestinal: Negative for vomiting, abdominal pain and diarrhea.  Genitourinary: Negative for flank pain.  Musculoskeletal: Negative for back pain and neck pain.  Skin: Negative for rash.  Neurological: Negative for headaches.  Hematological: Does not bruise/bleed easily.  Psychiatric/Behavioral: Negative for confusion.      Allergies  Review of patient's allergies indicates no known allergies.  Home Medications   Prior to Admission medications   Medication Sig Start Date End Date Taking? Authorizing Provider  Multiple Vitamin (MULTIVITAMIN) tablet Take 1 tablet by mouth daily.   Yes Historical Provider, MD  donepezil (ARICEPT) 5 MG tablet Take 1 tablet by mouth daily. 01/17/14   Historical Provider, MD  gabapentin (NEURONTIN) 300 MG capsule Take 1 capsule by mouth 2 (two) times daily. 05/20/13   Historical Provider, MD  nebivolol (BYSTOLIC) 5 MG tablet Take 1 tablet (5 mg total) by mouth daily. 12/16/14   Lewayne Bunting, MD  simvastatin (ZOCOR) 40 MG tablet Take 1 tablet (40 mg total) by mouth every evening. 03/20/15   Lewayne Bunting, MD  sulfaSALAzine (AZULFIDINE) 500 MG tablet Take 2 tablets by mouth daily. 01/02/14   Historical Provider, MD  tamsulosin (FLOMAX) 0.4 MG CAPS Take 0.4 mg by mouth daily after supper.    Historical  Provider, MD  warfarin (COUMADIN) 5 MG tablet TAKE AS DIRECTED 06/16/14   Lewayne BuntingBrian S Crenshaw, MD   BP 157/67 mmHg  Pulse 54  Temp(Src) 98.3 F (36.8 C) (Oral)  Resp 18  Ht 5\' 11"  (1.803 m)  Wt 79.379 kg  BMI 24.42 kg/m2  SpO2 99% Physical Exam  Constitutional: He appears well-developed and well-nourished. No distress.  HENT:  Head: Atraumatic.  Mouth/Throat: Oropharynx is clear and moist.  Eyes: Conjunctivae are normal. Pupils are equal, round, and reactive to light. No scleral icterus.  Neck: Neck supple. No  tracheal deviation present. No thyromegaly present.  Cardiovascular: Normal heart sounds and intact distal pulses.  Exam reveals no gallop and no friction rub.   Irregular rhythm, bradycardic, ?faint sys murmur  Pulmonary/Chest: Effort normal and breath sounds normal. No accessory muscle usage. No respiratory distress.  Abdominal: Soft. Bowel sounds are normal. He exhibits no distension and no mass. There is no tenderness. There is no rebound and no guarding.  Genitourinary:  No cva tenderness  Musculoskeletal: Normal range of motion. He exhibits no edema or tenderness.  CTLS spine, non tender, aligned, no step off. Good rom bil ext without pain or focal bony tenderness.   Neurological: He is alert.  Alert, content. Mildly confuse (at baseline). Moves bil ext purposefully w good strength.    Skin: Skin is warm and dry. No rash noted. He is not diaphoretic.  Psychiatric: He has a normal mood and affect.  Nursing note and vitals reviewed.   ED Course  Procedures (including critical care time) Labs Review   Ct Head Wo Contrast  08/29/2015  CLINICAL DATA:  Patient with irregular headache for 2 weeks. Shortness of breath for 1 week. Multiple falls EXAM: CT HEAD WITHOUT CONTRAST TECHNIQUE: Contiguous axial images were obtained from the base of the skull through the vertex without intravenous contrast. COMPARISON:  12/06/2012 FINDINGS: Prominence of the sulci and ventricles identified consistent with brain atrophy. There is mild low attenuation within the subcortical and periventricular white matter compatible with chronic microvascular disease. No evidence for acute brain infarct, intracranial hemorrhage or mass. The calvarium is intact. The paranasal sinuses and mastoid air cells are clear. IMPRESSION: 1. No acute intracranial abnormalities. 2. Chronic microvascular disease and brain atrophy Electronically Signed   By: Signa Kellaylor  Stroud M.D.   On: 08/29/2015 14:46       I have personally  reviewed and evaluated these images and lab results as part of my medical decision-making.   EKG Interpretation   Date/Time:  Tuesday August 29 2015 13:38:13 EST Ventricular Rate:  53 PR Interval:    QRS Duration: 104 QT Interval:  488 QTC Calculation: 457 R Axis:   -5 Text Interpretation:  Atrial fibrillation with slow ventricular response  Non-specific ST-t changes Confirmed by Denton LankSTEINL  MD, Caryn BeeKEVIN ([id number]) on  08/29/2015 1:58:54 PM      MDM   Iv ns. Continuous pulse ox and monitor.  Labs.  Reviewed nursing notes and prior charts for additional history.   Hr noted to be low, as low as 40 on monitor (on review prior office visits, pts hr typically in 50's), ?whether responsible for constellation symptoms, for now will hold b blocker.   Given functional decline, frequent falls, dyspnea w minimal exertion, bradycardia/afib - feel pt likely will require admission.  Labs all pending.    Will sign out to Dr Madilyn Hookees to check labs when back, and facilitate admission.         Cathren LaineKevin Myeshia Fojtik,  MD 08/29/15 1453

## 2015-08-29 NOTE — ED Notes (Signed)
[facility] is aware of bed and to transfer to [phone]

## 2015-08-29 NOTE — Consult Note (Addendum)
Admit date: 08/29/2015 Referring Physician  Dr. Maryfrances Bunnell Primary Physician  Dr. Loyal Jacobson Primary Cardiologist  Dr. Olga Millers Reason for Consultation  Weakness and bradycardia  HPI: Caleb Delgado is a 81 y.o. male with a past medical history significant for moderate dementia, chronic diastolic CHF, A. Fib on warfarin, CAD s/p CABG and bioprosthetic AVR in 2008, ulcerative colitis quiescent, and CKD stage III who presents with weakness for three weeks and increasing respiratory effort.  All of the following history is collected from the wife and daughter, as patient is unable to provide history due to dementia. The patient was in his usual state of health until about a month ago and his family noticed that he was weaker than normal. This occurred after he had a viral respiratory infection.  He normally ambulates around his home with a cane, but has seemed to have less stamina lately and even once on a walk by himself outside have to sit down on the curb and not able to get up. His daughter who is present at the bedside and provides most of the history also notes that he has had increased labored breathing and wheezing which have been progressing. Today the patient acknowledged that he felt "terrible" (meaning tired and short of breath) and so his family brought him to the ER.  In the ED, the patient had bradycardia and generalized weakness. His renal function was at baseline and he had no leukocytosis or anemia, but his BNP was 1200 and troponin was minimally elevated. A chest x-ray showed possibly congestion and trace pleural effusions and so [facility] were asked to admit for CHF exacerbation and they have consulted Cardiology for further evaluation.  He denies any chest pain but says that he has had some LE edema and SOB.       PMH:   Past Medical History  Diagnosis Date  . Stroke [facility])     TIA  . Expressive aphasia   . HTN (hypertension)   . Other and unspecified  hyperlipidemia   . BPH (benign prostatic hyperplasia)   . S/P AVR (aortic valve replacement)   . Unspecified sinusitis (chronic)   . CAD (coronary artery disease)   . Atrial fibrillation (HCC)   . Ulcerative colitis (HCC)      PSH:   Past Surgical History  Procedure Laterality Date  . Cardiac surgery    . Aortic valve repair    . Lung surgery      Thoracotomy for polyps  . Tee without cardioversion N/A 12/08/2012    Procedure: TRANSESOPHAGEAL ECHOCARDIOGRAM (TEE);  Surgeon: Lewayne Bunting, MD;  Location: [facility] ENDOSCOPY;  Service: Cardiovascular;  Laterality: N/A;    Allergies:  Tramadol Prior to Admit Meds:   Prescriptions prior to admission  Medication Sig Dispense Refill Last Dose  . albuterol (PROVENTIL HFA;VENTOLIN HFA) 108 (90 BASE) MCG/ACT inhaler Inhale 1-2 puffs into the lungs every 6 (six) hours as needed for wheezing or shortness of breath.   Past Month at Unknown time  . Cholecalciferol 1000 UNITS TBDP Take 1,000 Units by mouth at bedtime.   08/28/2015 at Unknown time  . donepezil (ARICEPT) 10 MG tablet Take 10 mg by mouth at bedtime.   08/28/2015 at Unknown time  . gabapentin (NEURONTIN) 300 MG capsule Take 300 mg by mouth at bedtime.    08/28/2015 at Unknown time  . Magnesium 250 MG TABS Take 250 mg by mouth daily.   08/29/2015 at Unknown time  . Multiple Vitamin (  MULTIVITAMIN) tablet Take 1 tablet by mouth daily.   08/29/2015 at Unknown time  . nebivolol (BYSTOLIC) 5 MG tablet Take 1 tablet (5 mg total) by mouth daily. 90 tablet 1 08/29/2015 at 0900  . simvastatin (ZOCOR) 40 MG tablet Take 1 tablet (40 mg total) by mouth every evening. 30 tablet 6 08/28/2015 at Unknown time  . sulfaSALAzine (AZULFIDINE) 500 MG tablet Take 500 mg by mouth 2 (two) times daily.    08/29/2015 at Unknown time  . tamsulosin (FLOMAX) 0.4 MG CAPS Take 0.4 mg by mouth daily after supper.   08/28/2015 at Unknown time  . warfarin (COUMADIN) 5 MG tablet TAKE AS DIRECTED (Patient taking differently:  Take 2.5-5 mg by mouth daily at 6 PM. Takes 5mg  on Tues, Thurs and Sat Takes 2.5mg  all other days) 30 tablet 12 08/28/2015 at Unknown time  . amoxicillin (AMOXIL) 500 MG tablet Take 2,000 mg by mouth once. For dental procedures   past week  . Influenza Vac Split High-Dose (FLUZONE HIGH-DOSE) 0.5 ML SUSY Inject 0.5 mLs into the muscle once.   past month   Fam HX:    Family History  Problem Relation Age of Onset  . Stroke Mother   . Stroke Father   . Diabetes Mother    Social HX:    Social History   Social History  . Marital Status: Married    Spouse Name: N/A  . Number of Children: 2  . Years of Education: N/A   Occupational History  . Not on file.   Social History Main Topics  . Smoking status: Former Smoker    Quit date: 10/07/1970  . Smokeless tobacco: Not on file  . Alcohol Use: Yes     Comment: Rarely  . Drug Use: No  . Sexual Activity: Not on file   Other Topics Concern  . Not on file   Social History Narrative     ROS:  All 11 ROS were addressed and are negative except what is stated in the HPI  Physical Exam: Blood pressure 162/65, pulse 61, temperature 97.8 F (36.6 C), temperature source Oral, resp. rate 18, height 5\' 11"  (1.803 m), weight 79.606 kg (175 lb 8 oz), SpO2 99 %.    General: Well developed, well nourished, in no acute distress Head: Eyes PERRLA, No xanthomas.   Normal cephalic and atramatic  Lungs:   Clear bilaterally to auscultation and percussion. Heart:   Irregularly irregular S1 S2 Pulses are 2+ & equal.            No carotid bruit. No JVD.  No abdominal bruits. No femoral bruits. Abdomen: Bowel sounds are positive, abdomen soft and non-tender without masses Extremities:   No clubbing, cyanosis or edema.  DP +1 Neuro: Alert and oriented X 3. Psych:  Good affect, responds appropriately    Labs:   Lab Results  Component Value Date   WBC 7.2 08/29/2015   HGB 11.6* 08/29/2015   HCT 35.8* 08/29/2015   MCV 99.2 08/29/2015   PLT 149*  08/29/2015    Recent Labs Lab 08/29/15 1440  NA 138  K 4.6  CL 107  CO2 24  BUN 21*  CREATININE 1.27*  CALCIUM 9.4  PROT 7.3  BILITOT 1.5*  ALKPHOS 69  ALT 17  AST 32  GLUCOSE 109*   No results found for: PTT Lab Results  Component Value Date   INR 3.22* 08/29/2015   INR 1.04 12/06/2012   Lab Results  Component Value Date  TROPONINI 0.04* 08/29/2015    No results found for: CHOL No results found for: HDL No results found for: LDLCALC No results found for: TRIG No results found for: CHOLHDL No results found for: LDLDIRECT    Radiology:  Dg Chest 2 View  08/29/2015  CLINICAL DATA:  Shortness of breath EXAM: CHEST  2 VIEW COMPARISON:  None currently available FINDINGS: Small bilateral pleural effusion. Mild pulmonary venous congestion without edema. Status post median sternotomy for aortic valve replacement and CABG. Normal heart size. Fairly symmetric biapical pleural thickening. Diffuse atherosclerotic calcification. IMPRESSION: Small pleural effusions without edema. Electronically Signed   By: Marnee Spring M.D.   On: 08/29/2015 14:52   Ct Head Wo Contrast  08/29/2015  CLINICAL DATA:  Patient with irregular headache for 2 weeks. Shortness of breath for 1 week. Multiple falls EXAM: CT HEAD WITHOUT CONTRAST TECHNIQUE: Contiguous axial images were obtained from the base of the skull through the vertex without intravenous contrast. COMPARISON:  12/06/2012 FINDINGS: Prominence of the sulci and ventricles identified consistent with brain atrophy. There is mild low attenuation within the subcortical and periventricular white matter compatible with chronic microvascular disease. No evidence for acute brain infarct, intracranial hemorrhage or mass. The calvarium is intact. The paranasal sinuses and mastoid air cells are clear. IMPRESSION: 1. No acute intracranial abnormalities. 2. Chronic microvascular disease and brain atrophy Electronically Signed   By: Signa Kell M.D.   On:  08/29/2015 14:46    EKG:  Atrial fibrillation with CVR and nonspecific ST abnormality  ASSESSMENT/PLAN: 1.  Weakness and failure to thrive ? Secondary to bradycardia and CHF 2.  Bradycardia with heart rates in the 30's on BB at home.  Stop bystolic and monitor on tele.   3.  Acute on chronic diastolic CHF - ? Whether bradycardia triggered his decompensation.  His wife states that he is not good about following a low sodium diet.  He really has no significant evidence on PE of volume overload but Cxray shows pleural effusions and BNP is elevated.  Will give a dose of IV Lasix tonight and reassess in the am.  Will recheck echo to make sure LVF remains normal.  He did have a viral URI about a month ago so need to consider viral DCM.   4.  Chronic atrial fibrillation on chronic systemic anticoagulation with warfarin.  INR therapeutic. 5.  Mildly elevated troponin most likely secondary to demand ischemia in the setting of bradycardia and CHF.  Continue to cycle. 6.  ASCAD with remote CABG with no angina.  Continue statin.  No ASA due to warfarin.   7.  S/P bioprosthetic AVR 8.  CKD stage III  Quintella Reichert, MD  08/29/2015  11:47 PM

## 2015-08-29 NOTE — Progress Notes (Signed)
ANTICOAGULATION CONSULT NOTE - Initial Consult  Pharmacy Consult for warfarin Indication: atrial fibrillation  Allergies  Allergen Reactions  . Tramadol Nausea Only    Patient Measurements: Height: 5\' 11"  (180.3 cm) Weight: 175 lb 8 oz (79.606 kg) IBW/kg (Calculated) : 75.3  Vital Signs: Temp: 97.8 F (36.6 C) (11/22 1729) Temp Source: Oral (11/22 1729) BP: 162/65 mmHg (11/22 1929) Pulse Rate: 61 (11/22 1929)  Labs:  Recent Labs  08/29/15 1440  HGB 11.6*  HCT 35.8*  PLT 149*  LABPROT 32.3*  INR 3.22*  CREATININE 1.27*  TROPONINI 0.04*    Estimated Creatinine Clearance: 43.6 mL/min (by C-G formula based on Cr of 1.27).  Assessment: 81 [facility] and bradycardic  PMH: dementia, dHF, afib on warfarin  AC: warfarin PTA for a fib. Admit INR 3.22. Last dose 11/21  Home dose: 5 mg Tu/Th/Sa, 2.5 mg AOD  Renal: SCr 1.27  Heme: H&H 11.6/35.8, Plt 149  Goal of Therapy:  INR 2-3 Monitor platelets by anticoagulation protocol: Yes   Plan:  Warfarin 3 mg x 1 tonight Daily INR, CBC q72h Monitor for s/sx of bleeding  Isaac BlissMichael Aqua Denslow, PharmD, BCPS, [facility] Clinical Pharmacist Pager [phone] 08/29/2015 8:18 PM

## 2015-08-29 NOTE — H&P (Signed)
History and Physical  Patient Name: Caleb Delgado     QQV:[id number]    DOB: 10/25/1925    DOA: 08/29/2015 Referring physician: Tilden Fossa, MD and Cathren Laine, MD PCP: Sid Falcon, MD      Chief Complaint: Weakness and dyspnea  HPI: Caleb Delgado is a 81 y.o. male with a past medical history significant for moderate dementia, chronic diastolic CHF, A. Fib on warfarin, CAD s/p CABG and bioprosthetic AVR in 2008, ulcerative colitis quiescent, and CKD stage III who presents with weakness for three weeks and increasing respiratory effort.  All of the following history is collected from the wife and daughter, as patient is unable to provide history due to dementia.  The patient was in his usual state of health until about a month ago and his family noticed that he was weaker than normal. He normally ambulates around his home with a cane, but has seemed to have less stamina lately and even once on a walk by himself outside have to sit down on the curb and not able to get up. His daughter who is present at the bedside and provides most of the history also notes that he has had increased labored breathing and wheezing which have been progressing. Today the patient acknowledged that he felt "terrible" (meaning tired and short of breath) and so his family brought him to the ER.  In the ED, the patient had bradycardia and generalized weakness.  His renal function was at baseline and he had no leukocytosisor anemia, but his BNP was 1200 pg per mLand troponin was minimally elevated.  A chest x-ray showed possibly congestion and trace pleural effusions and so [facility] were asked to admit for CHF exacerbation.     Review of Systems:  Pt complains of weakness, dyspnea, increased abdominal girth, hypertension and irregular heart rate on home monitor (which is unusual per wife), and fall twice. Pt denies any syncope, orthostasis, orthopnea, leg swelling, cough, fever.  All other systems  negative except as just noted or noted in the history of present illness.   Allergies: No Known Allergies  Home medications: 1. Donepezil 10 mg nightly 2. Gabapentin 300 mg nightly 3. Nebivolol 5 mg daily 4. Simvastatin 40 mg nightly 5. Sulfasalazine 500 mg twice daily 6. Tamsulosin 0.4 mg daily 7. Warfarin  Past medical history: 1. Chronic diastolic CHF, last EF 2015 60% 2. Moderate dementia 3. Atrial fibrillation on warfarin, chads 2 vas 6 4. Ulcerative colitis, quiescent 5. CKD stage III, baseline creatinine 1.3 mg/dL 6. Coronary artery disease, status post CABG in 2008 7. Aortic stenosis, status post bio prostatic AVR in 2008 8. History of CVA in 2014 9. BPH 10. Non-obstructive carotid disease, 40-60% blockage on L  Past surgical history: 1. Thoracotomy for lung polyps 2. CABG 3. AVR   Family history:  Mother, stroke, diabetes, HTN. Father, stroke.   Social History:  Patient lives with his wife in a townhome. TMs lites with a cane only. He does not drive any longer. 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 He is from [place] originally. He is a remote former smoking history.        Physical Exam: BP 162/65 mmHg  Pulse 61  Temp(Src) 97.8 F (36.6 C) (Oral)  Resp 18  Ht  (1.803 m)  Wt 79.606 kg (175 lb 8 oz)  BMI 24.49 kg/m2  SpO2 99% General appearance: Well-developed, adult male, alert and in no acute distress.  Pleasantly demented.   Eyes: Anicteric, conjunctiva pink, lids and lashes normal.     ENT: No nasal deformity, discharge, or epistaxis.  OP moist without lesions.   Lymph: No cervical, supraclavicular lymphadenopathy. Skin: Warm and dry.  No jaundice.   Cardiac: Bradycardic, irregular, nl S1-S2, actually I do not appreciate a systolic murmur.  Capillary refill is brisk.  JVP normal.  No LE edema.  Radial pulses 2+ and symmetric. Respiratory: Normal respiratory rate and rhythm.  CTAB without rales or wheezes. Abdomen:  Abdomen with voluntary guarding.  Mild diffuse TTP. No appreciable ascites, no distension.   MSK: No defOrmities or effusions. Neuro: Dementia, moderate.  Attention normal.  Speech is fluent.  Moves all extremities equally and with normal coordination.   Globally weak. Psych:Affect pleasant.  No evidence of aural or visual hallucinations or delusions.       Labs on Admission:  The metabolic panel shows normal sodium, potassium, bicarbonate. The serum creatinine is 1.27 mg/dL from a baseline of 1.3 mg/dL  The total bilirubin is 1.5 mg/dL the transaminases are normal.  The BNP is 1200 pg per mL. The INR is 2.22 Troponin is 0.04 ng per mL The urinalysis is unremarkable  The complete blood count shows mild chronic normocytic anemia, no leukocytosis, minimal thrombocytopenia.   Radiological Exams on Admission: Personally reviewed: Dg Chest 2 View  08/29/2015  CLINICAL DATA:  Shortness of breath EXAM: CHEST  2 VIEW COMPARISON:  None currently available FINDINGS: Small bilateral pleural effusion. Mild pulmonary venous congestion without edema. Status post median sternotomy for aortic valve replacement and CABG. Normal heart size. Fairly symmetric biapical pleural thickening. Diffuse atherosclerotic calcification. IMPRESSION: Small pleural effusions without edema. Electronically Signed   By: Marnee Spring M.D.   On: 08/29/2015 14:52   Ct Head Wo Contrast  08/29/2015  CLINICAL DATA:  Patient with irregular headache for 2 weeks. Shortness of breath for 1 week. Multiple falls EXAM: CT HEAD WITHOUT CONTRAST TECHNIQUE: Contiguous axial images were obtained from the base of the skull through the vertex without intravenous contrast. COMPARISON:  12/06/2012 FINDINGS: Prominence of the sulci and ventricles identified consistent with brain atrophy. There is mild low attenuation within the subcortical and periventricular white matter compatible with chronic microvascular disease. No evidence for acute brain  infarct, intracranial hemorrhage or mass. The calvarium is intact. The paranasal sinuses and mastoid air cells are clear. IMPRESSION: 1. No acute intracranial abnormalities. 2. Chronic microvascular disease and brain atrophy Electronically Signed   By: Signa Kell M.D.   On: 08/29/2015 14:46    EKG: Independently reviewed. Afib rate 50s.  No ST changes.  Flat T waves diffusely.    Assessment/Plan 1. Acute on chronic diastolic CHF:  This is new.  Last EF 60-65% in 2015 with diastolic dysfunction noted.  Signs of fluid overload are not prominent on exam, but BNP is elevated, CXR appears to me somewhat overloaded.  Last known weight 171 lbs 1 year ago, now 175 lbs.    -Furosemide 40 mg IV once -Strict I/O, daily weights, telemetry -PT/OT eval -Repeat troponin    2. Bradycardia:  This is new.  Presumably this is related to nebivolol and is contributing to #1 above. -Hold nebivolol for now. -Cardiology consulted, appreciate recommendations  3. Elevated total bilirubin: This is a minimal elevation, he has a reassuring abdomen exam except for some guarding, and no abdomen complaints.  Suspect this is congestive. -Repeat CMP after diuresis  4. Atrial fibrillation:  CHADS2Vasc 6.   -  Continue warfarin, goal INR 2-3 -Hold nebivolol -Telemetry  5. CKD stage III:  Stable  6. CAD and history CVA:  Stable.  -Continue simvastatin  7. BPH:  Stable.  -Continue tamsulosin  8. Dementia:  Stable.  -Continue donepezil. -Routine delirium prevention, minimize blood and attached lines, redirection and quiet environment.  9. Ulcerative colitis: Stable, no clinical disease. -Continue sulfasalazine.  10. Neuropathy and insomnia: -Continue gabapentin   DVT PPx: Warfarin Diet: Cardiac Consultants: Cardiology Code Status: Full Family Communication: Wife and  daughter present at the bedside.  CODE STATUS confirmed.  Plan described and all questions answered. Medical decision making:  What exists of the patient's previous chart was reviewed in depth and the case was discussed with Dr. Mayford Knifeurner from Cardiology. Patient seen 7:48 PM on 08/29/2015.  Disposition Plan:  Admit for diuresis and monitoring.  Echo tomorrow.  Anticipate discharge within 2-3 days if improved with diuresis and optimal HR.      Alberteen SamChristopher P Luwanda Starr [facility] Pager [phone]

## 2015-08-29 NOTE — ED Notes (Signed)
Per wife and daughter pt with irregular HA x 2 weeks-SOB x 1 week-pt denies pain-NAD-presents to triage in w/c

## 2015-08-29 NOTE — ED Notes (Signed)
MD and RN at bedside updating family and patient about plan of care and plan for admission. Patient and family verbalize understanding.

## 2015-08-29 NOTE — ED Notes (Signed)
Called Cardiology at [facility] for Dr. Madilyn Hookees for CHF

## 2015-08-29 NOTE — Progress Notes (Signed)
81 year old male from [facility] with past mental history of mild dementia, diastolic heart failure and atrial fibrillation on Coumadin comes in with progressive decline over the past month and found to be in acute CHF and bradycardia. (On beta blocker).  Cardiology already consulted and will be following.

## 2015-08-30 ENCOUNTER — Inpatient Hospital Stay (HOSPITAL_COMMUNITY): Payer: Medicare Other

## 2015-08-30 DIAGNOSIS — I509 Heart failure, unspecified: Secondary | ICD-10-CM | POA: Diagnosis not present

## 2015-08-30 DIAGNOSIS — I5033 Acute on chronic diastolic (congestive) heart failure: Secondary | ICD-10-CM | POA: Diagnosis not present

## 2015-08-30 DIAGNOSIS — I482 Chronic atrial fibrillation: Secondary | ICD-10-CM | POA: Diagnosis not present

## 2015-08-30 LAB — COMPREHENSIVE METABOLIC PANEL
ALK PHOS: 60 U/L (ref 38–126)
ALT: 17 U/L (ref 17–63)
AST: 26 U/L (ref 15–41)
Albumin: 3.4 g/dL — ABNORMAL LOW (ref 3.5–5.0)
Anion gap: 6 (ref 5–15)
BUN: 19 mg/dL (ref 6–20)
CO2: 29 mmol/L (ref 22–32)
Calcium: 9.4 mg/dL (ref 8.9–10.3)
Chloride: 106 mmol/L (ref 101–111)
Creatinine, Ser: 1.5 mg/dL — ABNORMAL HIGH (ref 0.61–1.24)
GFR, EST AFRICAN AMERICAN: 46 mL/min — AB (ref >60)
GFR, EST NON AFRICAN AMERICAN: 40 mL/min — AB (ref >60)
Glucose, Bld: 101 mg/dL — ABNORMAL HIGH (ref 65–99)
Potassium: 4.2 mmol/L (ref 3.5–5.1)
Sodium: 141 mmol/L (ref 135–145)
TOTAL PROTEIN: 6.5 g/dL (ref 6.5–8.1)
Total Bilirubin: 1.6 mg/dL — ABNORMAL HIGH (ref 0.3–1.2)

## 2015-08-30 LAB — CBC
HEMATOCRIT: 33.6 % — AB (ref 39.0–52.0)
HEMOGLOBIN: 11.3 g/dL — AB (ref 13.0–17.0)
MCH: 32.9 pg (ref 26.0–34.0)
MCHC: 33.6 g/dL (ref 30.0–36.0)
MCV: 98 fL (ref 78.0–100.0)
Platelets: 146 10*3/uL — ABNORMAL LOW (ref 150–400)
RBC: 3.43 MIL/uL — AB (ref 4.22–5.81)
RDW: 14.4 % (ref 11.5–15.5)
WBC: 8.1 10*3/uL (ref 4.0–10.5)

## 2015-08-30 LAB — PROTIME-INR
INR: 3.05 — AB (ref 0.00–1.49)
Prothrombin Time: 31 seconds — ABNORMAL HIGH (ref 11.6–15.2)

## 2015-08-30 MED ORDER — WARFARIN SODIUM 2.5 MG PO TABS
2.5000 mg | ORAL_TABLET | Freq: Once | ORAL | Status: AC
  Administered 2015-08-30: 2.5 mg via ORAL
  Filled 2015-08-30: qty 1

## 2015-08-30 NOTE — Evaluation (Signed)
Physical Therapy Evaluation Patient Details Name: Caleb NegusFred Carlton Estis MRN: [id number] DOB: 04/13/1926 Today's Date: 08/30/2015   History of Present Illness  Caleb NegusFred Carlton Dwiggins is a 81 y.o. male with a past medical history significant for moderate dementia, chronic diastolic CHF, A. Fib on warfarin, CAD s/p CABG and bioprosthetic AVR in 2008, ulcerative colitis quiescent, and CKD stage III who presents with weakness for three weeks and increasing respiratory effort Dx : acute on chronic CHF  Clinical Impression  Pt admitted with/for weakness, increased WOB and CHF.  Pt currently limited functionally due to the problems listed below.  (see problems list.)  Pt will benefit from PT to maximize function and safety to be able to get home safely with available assist of family.     Follow Up Recommendations Home health PT;Supervision for mobility/OOB    Equipment Recommendations       Recommendations for Other Services       Precautions / Restrictions Precautions Precautions: Fall Restrictions Weight Bearing Restrictions: No      Mobility  Bed Mobility Overal bed mobility: Modified Independent             General bed mobility comments: HOB up and use of rail coming out on left hand side of bed  Transfers Overall transfer level: Needs assistance Equipment used: 1 person hand held assist Transfers: Sit to/from Stand Sit to Stand: Min guard            Ambulation/Gait Ambulation/Gait assistance: Min guard Ambulation Distance (Feet): 380 Feet Assistive device: Straight cane Gait Pattern/deviations: Step-through pattern Gait velocity: able to speed up noticeably, but prefers slower. Gait velocity interpretation: at or above normal speed for age/gender General Gait Details: mild unsteadiness at times with wandering to maintain balance.  Great use of the Programmer, multimediacane  Stairs            Wheelchair Mobility    Modified Rankin (Stroke Patients Only)       Balance  Overall balance assessment: Needs assistance Sitting-balance support: No upper extremity supported;Feet supported Sitting balance-Leahy Scale: Good Sitting balance - Comments: able to sit and cross legs over to don socks   Standing balance support: No upper extremity supported Standing balance-Leahy Scale: Poor Standing balance comment: reliant on cane                             Pertinent Vitals/Pain Pain Assessment: No/denies pain    Home Living Family/patient expects to be discharged to:: Private residence Living Arrangements: Spouse/significant other Available Help at Discharge: Family;Available 24 hours/day Type of Home: House       Home Layout: One level Home Equipment: Cane - single point      Prior Function           Comments: S     Hand Dominance   Dominant Hand: Right    Extremity/Trunk Assessment   Upper Extremity Assessment: Defer to OT evaluation           Lower Extremity Assessment: Overall WFL for tasks assessed         Communication   Communication: No difficulties  Cognition Arousal/Alertness: Awake/alert Behavior During Therapy: WFL for tasks assessed/performed Overall Cognitive Status: History of cognitive impairments - at baseline                      General Comments      Exercises        Assessment/Plan  PT Assessment Patient needs continued PT services  PT Diagnosis Other (comment);Difficulty walking (decr activity tolerance.)   PT Problem List Decreased activity tolerance;Decreased balance;Decreased mobility;Cardiopulmonary status limiting activity  PT Treatment Interventions Gait training;Functional mobility training;Therapeutic activities;Balance training;Patient/family education;Stair training   PT Goals (Current goals can be found in the Care Plan section) Acute Rehab PT Goals Patient Stated Goal: home soon PT Goal Formulation: With patient Time For Goal Achievement: 09/06/15 Potential to  Achieve Goals: Good    Frequency Min 3X/week   Barriers to discharge        Co-evaluation               End of Session   Activity Tolerance: Patient tolerated treatment well Patient left: in chair;with call bell/phone within reach;with chair alarm set Nurse Communication: Mobility status         Time: 1000-1024 PT Time Calculation (min) (ACUTE ONLY): 24 min   Charges:   PT Evaluation $Initial PT Evaluation Tier I: 1 Procedure PT Treatments $Gait Training: 8-22 mins   PT G Codes:        Timiko Offutt, Eliseo Gum 08/30/2015, 11:21 AM 08/30/2015  [place], PT [phone]  (pager)

## 2015-08-30 NOTE — Care Management Obs Status (Signed)
MEDICARE OBSERVATION STATUS NOTIFICATION   Patient Details  Name: Elder NegusFred Carlton Lonon MRN: [id number] Date of Birth: 10/10/1925   Medicare Observation Status Notification Given:       Cherrie DistanceChandler, Nichoals Heyde L, RN 08/30/2015, 2:38 PM

## 2015-08-30 NOTE — Progress Notes (Signed)
Utilization review completed. Armen Waring, RN, BSN. 

## 2015-08-30 NOTE — Progress Notes (Signed)
Elder NegusFred Carlton Glendinning ZOX:[id number] DOB: 03/13/1926 DOA: 08/29/2015 PCP: Sid FalconKALISH, MICHAEL J, MD  Brief narrative: 81 y/o ? Prior tia and then CVA 2014 with expressive aphasia Afib chad2vasc2 score 4 Htn Bph S/p AoV replacemetn + CABG 2008 queiscent Ulcerative COlitis CKD III Prior thoracotomy for lung polyps  Admitted with Signific SOB and "tiredness" H/o is limited by dementia  Past medical history-As per Problem list Chart reviewed as below-   Consultants:  Cardiology  Procedures:  None  Antibiotics:  None   Subjective    fair  mildy confused and a little slow No overt c/o currently tol diet  no cp no sob   Objective    Interim History:   Telemetry: Bradycardic heart rate noted at one point to be 32   Objective: Filed Vitals:   08/29/15 1929 08/29/15 1936 08/30/15 0002 08/30/15 1147  BP: 162/65  133/53 123/54  Pulse: 61  46 55  Temp:   98 F (36.7 C) 98.6 F (37 C)  TempSrc:   Oral Oral  Resp: 18  17 16   Height:      Weight:  79.606 kg (175 lb 8 oz)    SpO2: 99%  97% 97%    Intake/Output Summary (Last 24 hours) at 08/30/15 1203 Last data filed at 08/30/15 0904  Gross per 24 hour  Intake    480 ml  Output   1315 ml  Net   -835 ml    Exam:  General: eomi ncat Cardiovascular: s1 s 2 no m/r/g Respiratory: clear no added sound Abdomen: soft nt nd  Skin no le edema Neuro no le edema  Data Reviewed: Basic Metabolic Panel:  Recent Labs Lab 08/29/15 1440 08/30/15 0525  NA 138 141  K 4.6 4.2  CL 107 106  CO2 24 29  GLUCOSE 109* 101*  BUN 21* 19  CREATININE 1.27* 1.50*  CALCIUM 9.4 9.4   Liver Function Tests:  Recent Labs Lab 08/29/15 1440 08/30/15 0525  AST 32 26  ALT 17 17  ALKPHOS 69 60  BILITOT 1.5* 1.6*  PROT 7.3 6.5  ALBUMIN 4.0 3.4*   No results for input(s): LIPASE, AMYLASE in the last 168 hours. No results for input(s): AMMONIA in the last 168 hours. CBC:  Recent Labs Lab 08/29/15 1440 08/30/15 0525   WBC 7.2 8.1  HGB 11.6* 11.3*  HCT 35.8* 33.6*  MCV 99.2 98.0  PLT 149* 146*   Cardiac Enzymes:  Recent Labs Lab 08/29/15 1440 08/29/15 2233  TROPONINI 0.04* 0.04*   BNP: Invalid input(s): POCBNP CBG: No results for input(s): GLUCAP in the last 168 hours.  No results found for this or any previous visit (from the past 240 hour(s)).   Studies:              All Imaging reviewed and is as per above notation   Scheduled Meds: . donepezil  10 mg Oral QHS  . gabapentin  300 mg Oral QHS  . simvastatin  40 mg Oral QPM  . sodium chloride  3 mL Intravenous Q12H  . sulfaSALAzine  500 mg Oral BID  . tamsulosin  0.4 mg Oral QPC supper  . warfarin  2.5 mg Oral ONCE-1800  . Warfarin - Pharmacist Dosing Inpatient   Does not apply q1800   Continuous Infusions:    Assessment/Plan:   1. Weakness/tiredness-unclear etiology. Does not appear to be volume overloaded potentially could be secondary to bradycardia 2. Stable CHF-initially given Lasix 40 mg X1 last EF 2015  60-65% diastolic dysfunction. Cardiology recommends no further diuresis at present and I spoke to patient and famil about this all probabyl being 2/2 to Bisoprolol  3. Sinus bradycardia-this is resolved off of nebivolol and would not continue on discharge 4. Mild hyperbilirubinemia->? etiology. Could  be secondary to microangiopathic causes from AV valve Outpatient follow-up 5. Ulcerative colitis-continue sulfasalazine 6. History of aortic valve repair + CABG 2008-currently stable 7. Atrial fibrillation [place] score 4-not on rate controlling agents at present given bradycardia and not a candidate for pacemaker present time-would continue Coumadin, INR is 3    Long discussions detailing our collective thoughts at present at bedside with multiple family members If remains stable could d/c home in am c [facility] PT/OT for supervision   Pleas Koch, MD  [facility] Pager [phone] 08/30/2015, 12:03 PM    LOS: 1 day

## 2015-08-30 NOTE — Care Management Note (Signed)
Case Management Note  Patient Details  Name: Caleb Delgado MRN: [id number] Date of Birth: 09/18/1926  Subjective/Objective:           Admitted with CHF         Action/Plan: Patient lives at home with spouse. Has private insurance with [facility] with prescription drug coverage, pharmacy of choice is [facility] - no problems getting his medication. Patient could benefit from a Disease Management Program for CHF. HHC choice offered, spouse chose [facility]. Lupita Leashonna with [facility] called for arrangements. Also rollator / rolling walker with seat ordered as requested. Attending MD at discharge please enter the face to face document [facility] per Medicare guidelines.  Expected Discharge Date:   possibly 08/31/2015               Expected Discharge Plan:  Home w Home Health Services  Discharge planning Services  CM Consult    Choice offered to:  Spouse  DME Arranged:  Walker rolling with seat DME Agency:  [facility]  HH Arranged:  RN, Disease Management, PT HH Agency:  [facility]  Status of Service:  In process, will continue to follow  Reola MosherChandler, Kolleen Ochsner L, RN,MHA,BSN [phone] 08/30/2015, 11:17 AM

## 2015-08-30 NOTE — Progress Notes (Signed)
    Subjective:  Denies CP or dyspnea; confused   Objective:  Filed Vitals:   08/29/15 1925 08/29/15 1929 08/29/15 1936 08/30/15 0002  BP:  162/65  133/53  Pulse:  61  46  Temp:    98 F (36.7 C)  TempSrc:    Oral  Resp:  18  17  Height:      Weight:   79.606 kg (175 lb 8 oz)   SpO2: 97% 99%  97%    Intake/Output from previous day:  Intake/Output Summary (Last 24 hours) at 08/30/15 0932 Last data filed at 08/30/15 [id number]  Gross per 24 hour  Intake    480 ml  Output   1315 ml  Net   -835 ml    Physical Exam: Physical exam: Well-developed frail in no acute distress.  Skin is warm and dry.  HEENT is normal.  Neck is supple.  Chest is clear to auscultation with normal expansion.  Cardiovascular exam is irregular and bradycardic; 2/6 systolic murmur Abdominal exam nontender or distended. No masses palpated. Extremities show no edema. neuro grossly intact    Lab Results: Basic Metabolic Panel:  Recent Labs  82/95/6211/22/16 1440 08/30/15 0525  NA 138 141  K 4.6 4.2  CL 107 106  CO2 24 29  GLUCOSE 109* 101*  BUN 21* 19  CREATININE 1.27* 1.50*  CALCIUM 9.4 9.4   CBC:  Recent Labs  08/29/15 1440 08/30/15 0525  WBC 7.2 8.1  HGB 11.6* 11.3*  HCT 35.8* 33.6*  MCV 99.2 98.0  PLT 149* 146*   Cardiac Enzymes:  Recent Labs  08/29/15 1440 08/29/15 2233  TROPONINI 0.04* 0.04*     Assessment/Plan:  1 Weakness-question secondary to bradycardia. Patient remains bradycardic at times on telemetry. Continue to hold beta blocker and follow telemetry today. 2 acute diastolic congestive heart failure-patient does not appear to be volume overloaded on examination. I would not diurese further. 3 Permanent atrial fibrillation- CHADSvasc 6; continue coumadin. Hold beta blocker given bradycardia. 4 coronary artery disease-continue statin. No aspirin given need for Coumadin. 5 hypertension-continue present medications and follow. 6 hyperlipidemia-continue statin. 7  chronic stage III kidney disease. 8 minimal elevation in troponin-there is no clear trend and patient is not having chest pain. Presentation is not consistent with acute coronary syndrome. No further ischemia evaluation. We will watch telemetry today. If no symptoms and heart rate improved patient could be discharged tomorrow morning and follow-up with me.  Olga MillersBrian Crenshaw 08/30/2015, 9:32 AM

## 2015-08-30 NOTE — Progress Notes (Signed)
ANTICOAGULATION CONSULT NOTE - Follow Up Consult  Pharmacy Consult for coumadin Indication: atrial fibrillation  Allergies  Allergen Reactions  . Tramadol Nausea Only    Patient Measurements: Height: 5\' 11"  (180.3 cm) Weight: 175 lb 8 oz (79.606 kg) IBW/kg (Calculated) : 75.3  Vital Signs: Temp: 98 F (36.7 C) (11/23 0002) Temp Source: Oral (11/23 0002) BP: 133/53 mmHg (11/23 0002) Pulse Rate: 46 (11/23 0002)  Labs:  Recent Labs  08/29/15 1440 08/29/15 2233 08/30/15 0525  HGB 11.6*  --  11.3*  HCT 35.8*  --  33.6*  PLT 149*  --  146*  LABPROT 32.3*  --  31.0*  INR 3.22*  --  3.05*  CREATININE 1.27*  --  1.50*  TROPONINI 0.04* 0.04*  --     Estimated Creatinine Clearance: 37 mL/min (by C-G formula based on Cr of 1.5).   Medications:  Scheduled:  . donepezil  10 mg Oral QHS  . gabapentin  300 mg Oral QHS  . simvastatin  40 mg Oral QPM  . sodium chloride  3 mL Intravenous Q12H  . sulfaSALAzine  500 mg Oral BID  . tamsulosin  0.4 mg Oral QPC supper  . Warfarin - Pharmacist Dosing Inpatient   Does not apply q1800    Assessment: 81 [facility] and bradycardic. He is on warfarin PTA for a fib. Admit INR 3.22 and INR today is 3.05.  Home dose: 5 mg Tu/Th/Sa, 2.5 mg AOD  Goal of Therapy:  INR 2-3 Monitor platelets by anticoagulation protocol: Yes   Plan:  -Coumadin 2.5mg  po today -Daily PT/INR  Harland GermanAndrew Alam Guterrez, Pharm D 08/30/2015 8:36 AM

## 2015-08-30 NOTE — Evaluation (Signed)
Occupational Therapy Evaluation Patient Details Name: Caleb NegusFred Carlton Fickel MRN: [id number] DOB: 02/04/1926 Today's Date: 08/30/2015    History of Present Illness Caleb Delgado is a 81 y.o. male with a past medical history significant for moderate dementia, chronic diastolic CHF, A. Fib on warfarin, CAD s/p CABG and bioprosthetic AVR in 2008, ulcerative colitis quiescent, and CKD stage III who presents with weakness for three weeks and increasing respiratory effort Dx : acute on chronic CHF   Clinical Impression   This 81 yo male admitted with above presents to acute OT with decreased balance when up his feet affecting independence and safety with basic ADLs. He will benefit from acute OT with follow up HHOT to get to a S level or better for basic ADLs.    Follow Up Recommendations  Home health OT;Supervision/Assistance - 24 hour;Other (comment) (with someone with him anytime he is up on his feet)    Equipment Recommendations  None recommended by OT       Precautions / Restrictions Precautions Precautions: Fall Restrictions Weight Bearing Restrictions: No      Mobility Bed Mobility Overal bed mobility: Modified Independent             General bed mobility comments: HOB up and use of rail coming out on left hand side of bed  Transfers Overall transfer level: Needs assistance Equipment used: 1 person hand held assist Transfers: Sit to/from Stand Sit to Stand: Min guard              Balance Overall balance assessment: Needs assistance Sitting-balance support: No upper extremity supported;Feet supported Sitting balance-Leahy Scale: Good Sitting balance - Comments: able to sit and cross legs over to don socks   Standing balance support: Single extremity supported;During functional activity Standing balance-Leahy Scale: Poor                              ADL Overall ADL's : Needs assistance/impaired Eating/Feeding: Independent;Sitting    Grooming: Wash/dry face;Oral care;Min guard;Standing   Upper Body Bathing: Set up;Supervision/ safety;Sitting   Lower Body Bathing: Min guard;Sit to/from stand   Upper Body Dressing : Set up;Sitting;Supervision/safety   Lower Body Dressing: Min guard;Sit to/from stand   Toilet Transfer: Min guard;Ambulation;Comfort height toilet;Grab bars   Toileting- Clothing Manipulation and Hygiene: Min guard;Sit to/from stand                         Pertinent Vitals/Pain Pain Assessment: No/denies pain     Hand Dominance Right   Extremity/Trunk Assessment Upper Extremity Assessment Upper Extremity Assessment: Generalized weakness   Lower Extremity Assessment Lower Extremity Assessment: Defer to PT evaluation       Communication Communication Communication: No difficulties   Cognition Arousal/Alertness: Awake/alert Behavior During Therapy: WFL for tasks assessed/performed Overall Cognitive Status: History of cognitive impairments - at baseline                                Home Living Family/patient expects to be discharged to:: Private residence Living Arrangements: Spouse/significant other Available Help at Discharge: Family;Available 24 hours/day               Bathroom Shower/Tub: Producer, television/film/videoWalk-in shower   Bathroom Toilet: Handicapped height     Home Equipment: Cane - single point          Prior Functioning/Environment  Comments: S    OT Diagnosis: Generalized weakness;Cognitive deficits   OT Problem List: Decreased strength;Impaired balance (sitting and/or standing)   OT Treatment/Interventions: Self-care/ADL training;Patient/family education;Balance training;DME and/or AE instruction;Therapeutic activities    OT Goals(Current goals can be found in the care plan section) Acute Rehab OT Goals Patient Stated Goal: home soon OT Goal Formulation: With patient Time For Goal Achievement: 09/06/15 Potential to Achieve Goals: Good  OT  Frequency: Min 2X/week          End of Session Equipment Utilized During Treatment: Gait belt Nurse Communication:  (NT: hands on min A-min guard A for ambulation)  Activity Tolerance: Patient tolerated treatment well Patient left: in chair;with call bell/phone within reach;with chair alarm set   Time: [phone] OT Time Calculation (min): 24 min Charges:  OT General Charges $OT Visit: 1 Procedure OT Evaluation $Initial OT Evaluation Tier I: 1 Procedure OT Treatments $Self Care/Home Management : 8-22 mins  Evette Georges [phone] 08/30/2015, 10:44 AM

## 2015-08-31 DIAGNOSIS — I482 Chronic atrial fibrillation: Secondary | ICD-10-CM | POA: Diagnosis not present

## 2015-08-31 DIAGNOSIS — I5033 Acute on chronic diastolic (congestive) heart failure: Secondary | ICD-10-CM | POA: Diagnosis not present

## 2015-08-31 LAB — PROTIME-INR
INR: 2.9 — ABNORMAL HIGH (ref 0.00–1.49)
Prothrombin Time: 29.8 seconds — ABNORMAL HIGH (ref 11.6–15.2)

## 2015-08-31 LAB — BASIC METABOLIC PANEL
Anion gap: 7 (ref 5–15)
BUN: 22 mg/dL — AB (ref 6–20)
CHLORIDE: 106 mmol/L (ref 101–111)
CO2: 26 mmol/L (ref 22–32)
CREATININE: 1.39 mg/dL — AB (ref 0.61–1.24)
Calcium: 9.1 mg/dL (ref 8.9–10.3)
GFR calc Af Amer: 51 mL/min — ABNORMAL LOW (ref >60)
GFR calc non Af Amer: 44 mL/min — ABNORMAL LOW (ref >60)
Glucose, Bld: 100 mg/dL — ABNORMAL HIGH (ref 65–99)
POTASSIUM: 3.9 mmol/L (ref 3.5–5.1)
Sodium: 139 mmol/L (ref 135–145)

## 2015-08-31 MED ORDER — WARFARIN SODIUM 2.5 MG PO TABS: 2.5000 mg | ORAL_TABLET | Freq: Once | ORAL | Status: DC

## 2015-08-31 NOTE — Progress Notes (Signed)
    Subjective:  Denies CP or dyspnea   Objective:  Filed Vitals:   08/30/15 0002 08/30/15 1147 08/30/15 2049 08/31/15 0639  BP: 133/53 123/54 139/57 114/47  Pulse: 46 55 53 56  Temp: 98 F (36.7 C) 98.6 F (37 C) 98.6 F (37 C) 97.8 F (36.6 C)  TempSrc: Oral Oral Oral Oral  Resp: 17 16 18 16   Height:      Weight:    171 lb (77.565 kg)  SpO2: 97% 97% 97% 98%    Intake/Output from previous day:  Intake/Output Summary (Last 24 hours) at 08/31/15 0800 Last data filed at 08/31/15 [id number]  Gross per 24 hour  Intake   1015 ml  Output    665 ml  Net    350 ml    Physical Exam: Physical exam: Well-developed frail in no acute distress.  Skin is warm and dry.  HEENT is normal.  Neck is supple.  Chest is clear to auscultation with normal expansion.  Cardiovascular exam is irregular and bradycardic; 2/6 systolic murmur Abdominal exam nontender or distended. No masses palpated. Extremities show no edema. neuro grossly intact    Lab Results: Basic Metabolic Panel:  Recent Labs  11/91/4711/23/16 0525 08/31/15 0303  NA 141 139  K 4.2 3.9  CL 106 106  CO2 29 26  GLUCOSE 101* 100*  BUN 19 22*  CREATININE 1.50* 1.39*  CALCIUM 9.4 9.1   CBC:  Recent Labs  08/29/15 1440 08/30/15 0525  WBC 7.2 8.1  HGB 11.6* 11.3*  HCT 35.8* 33.6*  MCV 99.2 98.0  PLT 149* 146*   Cardiac Enzymes:  Recent Labs  08/29/15 1440 08/29/15 2233  TROPONINI 0.04* 0.04*     Assessment/Plan:  1 Weakness-question secondary to bradycardia. HR improved although occasional bradycardia. Continue off beta blocker. Patient can be DCed from a cardiac standpoint and fu with me 2-4 weeks. 2 acute diastolic congestive heart failure-patient does not appear to be volume overloaded on examination. I would not diurese further. 3 Permanent atrial fibrillation- CHADSvasc 6; continue coumadin. Hold beta blocker given bradycardia. 4 coronary artery disease-continue statin. No aspirin given need for  Coumadin. 5 hypertension-continue present medications and follow. 6 hyperlipidemia-continue statin. 7 chronic stage III kidney disease. 8 minimal elevation in troponin-there is no clear trend and patient is not having chest pain. Presentation is not consistent with acute coronary syndrome. No further ischemia evaluation.    Caleb MillersBrian Xayla Delgado 08/31/2015, 8:00 AM

## 2015-08-31 NOTE — Progress Notes (Signed)
Cm spoke with RN and [facility] but patient still needs face to face. RN states that she will call and get the face to face as well. CM remains available should additional needs arise. CM paged MD Samtani to advise that face to face is needed.

## 2015-08-31 NOTE — Progress Notes (Addendum)
ANTICOAGULATION CONSULT NOTE - Follow Up Consult  Pharmacy Consult for coumadin Indication: atrial fibrillation  Allergies  Allergen Reactions  . Tramadol Nausea Only    Patient Measurements: Height: 5\' 11"  (180.3 cm) Weight: 171 lb (77.565 kg) (scale c) IBW/kg (Calculated) : 75.3  Vital Signs: Temp: 97.8 F (36.6 C) (11/24 0639) Temp Source: Oral (11/24 0639) BP: 114/47 mmHg (11/24 0639) Pulse Rate: 56 (11/24 0639)  Labs:  Recent Labs  08/29/15 1440 08/29/15 2233 08/30/15 0525 08/31/15 0303  HGB 11.6*  --  11.3*  --   HCT 35.8*  --  33.6*  --   PLT 149*  --  146*  --   LABPROT 32.3*  --  31.0* 29.8*  INR 3.22*  --  3.05* 2.90*  CREATININE 1.27*  --  1.50* 1.39*  TROPONINI 0.04* 0.04*  --   --     Estimated Creatinine Clearance: 39.9 mL/min (by C-G formula based on Cr of 1.39).   Medications:  Scheduled:  . donepezil  10 mg Oral QHS  . gabapentin  300 mg Oral QHS  . simvastatin  40 mg Oral QPM  . sodium chloride  3 mL Intravenous Q12H  . sulfaSALAzine  500 mg Oral BID  . tamsulosin  0.4 mg Oral QPC supper  . Warfarin - Pharmacist Dosing Inpatient   Does not apply q1800    Assessment: 81 [facility] and bradycardic. He is on warfarin PTA for a fib. Admit INR 3.22. INR is 2.9 today.   Home dose: 5 mg Tu/Th/Sa, 2.5 mg AOD  Goal of Therapy:  INR 2-3 Monitor platelets by anticoagulation protocol: Yes   Plan:   Coumadin 2.5mg  po today Daily PT/INR  Ulyses SouthwardMinh Kadar Chance, PharmD Pager: [phone] 08/31/2015 9:27 AM  Addendum:  If dc home today, change home dose to Coumadin 2.5mg  qday except 5mg  Mon/Fri

## 2015-08-31 NOTE — Progress Notes (Signed)
Pt d/c home with daughter in no distress.D/c instructions given to and d/w  Daughter, daughter verbalizes understanding

## 2015-08-31 NOTE — Discharge Summary (Addendum)
Physician Discharge Summary  Elder NegusFred Carlton Paci OZH:[id number] DOB: 10/29/1925 DOA: 08/29/2015  PCP: Sid FalconKALISH, MICHAEL J, MD  Admit date: 08/29/2015 Discharge date: 08/31/2015  Time spent: 25 minutes  Recommendations for Outpatient Follow-up:  1. Get CMEt and cbc 1 week 2. Please monitor bilirubin as an outpatient 3. If "weakness" and "tiredness" persist as an OP would pursue limited workup for this as an outpatient as per primary care physician 4. Please do not resume beta blocker until seen by Dr. Jens Somrenshaw of cardiology-has an appointment scheduled soon 5. Therapy recommended home health PT   Discharge Diagnoses:  Principal Problem:   Acute on chronic diastolic heart failure (HCC) Active Problems:   HTN (hypertension)   Atrial fibrillation (HCC)   Bradycardia   CKD (chronic kidney disease), stage III   CHF (congestive heart failure) (HCC)   Discharge Condition: Fair  Diet recommendation: Heart healthy low-salt  Filed Weights   08/29/15 1342 08/29/15 1936 08/31/15 0639  Weight: 79.379 kg (175 lb) 79.606 kg (175 lb 8 oz) 77.565 kg (171 lb)    History of present illness:  81 y/o ? Prior tia and then CVA 2014 with expressive aphasia Afib chad2vasc2 score 4 Htn Bph S/p AoV replacemetn + CABG 2008 queiscent Ulcerative COlitis CKD III Prior thoracotomy for lung polyps  Admitted with Signific SOB and "tiredness" H/o is limited by dementia  Hospital Course:    1. Weakness/tiredness-unclear etiology. Does not appear to be volume overloaded potentially could be secondary to bradycardia-if there is a persistent complaint of these 2 issues may be an outpatient workup involving a B12, TSH and other workup should be pursued although I suspect this may be adult failure to thrive 2. Stable CHF-initially given Lasix 40 mg X1 last EF 2015 60-65% diastolic dysfunction. Cardiology recommends no further diuresis at present and I spoke to patient and famil about this all probabyl  being 2/2 to Bisoprolol  3. Sinus bradycardia-this is resolved off of nebivolol and would not continue on discharge 4. Mild hyperbilirubinemia->? etiology. Could be secondary to microangiopathic causes from AV valve Outpatient follow-up 5. Ulcerative colitis-continue sulfasalazine 6. History of aortic valve repair + CABG 2008-currently stable 7. Atrial fibrillation [place] score 4-not on rate controlling agents at present given bradycardia and not a candidate for pacemaker present time-would continue Coumadin, INR is 3   Long discussions detailing our collective thoughts at present at bedside with multiple family members If remains stable could d/c home in [facility] for supervision  Consultations:  Cardiology   Discharge Exam: Filed Vitals:   08/30/15 2049 08/31/15 0639  BP: 139/57 114/47  Pulse: 53 56  Temp: 98.6 F (37 C) 97.8 F (36.6 C)  Resp: 18 16   slightly confused answering NO for multiple questions otherwise is stable General: eomi ncat Cardiovascular:  s1 s 2no m/r/g Respiratory: clear  Discharge Instructions    Current Discharge Medication List    CONTINUE these medications which have NOT CHANGED   Details  albuterol (PROVENTIL HFA;VENTOLIN HFA) 108 (90 BASE) MCG/ACT inhaler Inhale 1-2 puffs into the lungs every 6 (six) hours as needed for wheezing or shortness of breath.    Cholecalciferol 1000 UNITS TBDP Take 1,000 Units by mouth at bedtime.    donepezil (ARICEPT) 10 MG tablet Take 10 mg by mouth at bedtime.    gabapentin (NEURONTIN) 300 MG capsule Take 300 mg by mouth at bedtime.     Magnesium 250 MG TABS Take 250 mg by mouth daily.  Multiple Vitamin (MULTIVITAMIN) tablet Take 1 tablet by mouth daily.    simvastatin (ZOCOR) 40 MG tablet Take 1 tablet (40 mg total) by mouth every evening. Qty: 30 tablet, Refills: 6    sulfaSALAzine (AZULFIDINE) 500 MG tablet Take 500 mg by mouth 2 (two) times daily.     tamsulosin (FLOMAX) 0.4 MG CAPS Take  0.4 mg by mouth daily after supper.    warfarin (COUMADIN) 5 MG tablet TAKE AS DIRECTED Qty: 30 tablet, Refills: 12      STOP taking these medications     nebivolol (BYSTOLIC) 5 MG tablet      amoxicillin (AMOXIL) 500 MG tablet      Influenza Vac Split High-Dose (FLUZONE HIGH-DOSE) 0.5 ML SUSY        Allergies  Allergen Reactions  . Tramadol Nausea Only   Follow-up Information    Follow up with [facility].   Why:  They will do your home health care at your home   Contact information:   [address] [place] [address] [phone]        The results of significant diagnostics from this hospitalization (including imaging, microbiology, ancillary and laboratory) are listed below for reference.    Significant Diagnostic Studies: Dg Chest 2 View  08/29/2015  CLINICAL DATA:  Shortness of breath EXAM: CHEST  2 VIEW COMPARISON:  None currently available FINDINGS: Small bilateral pleural effusion. Mild pulmonary venous congestion without edema. Status post median sternotomy for aortic valve replacement and CABG. Normal heart size. Fairly symmetric biapical pleural thickening. Diffuse atherosclerotic calcification. IMPRESSION: Small pleural effusions without edema. Electronically Signed   By: Marnee Spring M.D.   On: 08/29/2015 14:52   Ct Head Wo Contrast  08/29/2015  CLINICAL DATA:  Patient with irregular headache for 2 weeks. Shortness of breath for 1 week. Multiple falls EXAM: CT HEAD WITHOUT CONTRAST TECHNIQUE: Contiguous axial images were obtained from the base of the skull through the vertex without intravenous contrast. COMPARISON:  12/06/2012 FINDINGS: Prominence of the sulci and ventricles identified consistent with brain atrophy. There is mild low attenuation within the subcortical and periventricular white matter compatible with chronic microvascular disease. No evidence for acute brain infarct, intracranial hemorrhage or mass. The calvarium  is intact. The paranasal sinuses and mastoid air cells are clear. IMPRESSION: 1. No acute intracranial abnormalities. 2. Chronic microvascular disease and brain atrophy Electronically Signed   By: Signa Kell M.D.   On: 08/29/2015 14:46    Microbiology: No results found for this or any previous visit (from the past 240 hour(s)).   Labs: Basic Metabolic Panel:  Recent Labs Lab 08/29/15 1440 08/30/15 0525 08/31/15 0303  NA 138 141 139  K 4.6 4.2 3.9  CL 107 106 106  CO2 GLUCOSE 109* 101* 100*  BUN 21* 19 22*  CREATININE 1.27* 1.50* 1.39*  CALCIUM 9.4 9.4 9.1   Liver Function Tests:  Recent Labs Lab 08/29/15 1440 08/30/15 0525  AST 32 26  ALT 17 17  ALKPHOS 69 60  BILITOT 1.5* 1.6*  PROT 7.3 6.5  ALBUMIN 4.0 3.4*   No results for input(s): LIPASE, AMYLASE in the last 168 hours. No results for input(s): AMMONIA in the last 168 hours. CBC:  Recent Labs Lab 08/29/15 1440 08/30/15 0525  WBC 7.2 8.1  HGB 11.6* 11.3*  HCT 35.8* 33.6*  MCV 99.2 98.0  PLT 149* 146*   Cardiac Enzymes:  Recent Labs Lab 08/29/15 1440 08/29/15 2233  TROPONINI 0.04* 0.04*   BNP: BNP (last 3 results)  Recent Labs  08/29/15 1440  BNP 1201.6*    ProBNP (last 3 results) No results for input(s): PROBNP in the last 8760 hours.  CBG: No results for input(s): GLUCAP in the last 168 hours.     SignedRhetta Mura  [facility] 08/31/2015, 8:04 AM

## 2015-09-04 ENCOUNTER — Telehealth: Payer: Self-pay | Admitting: Cardiology

## 2015-09-04 NOTE — Progress Notes (Signed)
Late Addendum to the Initial Evaluation    08/30/15 1111  PT Time Calculation  PT Start Time (ACUTE ONLY) 1000  PT Stop Time (ACUTE ONLY) 1024  PT Time Calculation (min) (ACUTE ONLY) 24 min  PT G-Codes **NOT FOR INPATIENT CLASS**  Functional Assessment Tool Used clinical judgement  Functional Limitation Mobility: Walking and moving around  Mobility: Walking and Moving Around Current Status (Z6109(G8978) CI  Mobility: Walking and Moving Around Goal Status (U0454(G8979) CI  PT General Charges  $$ ACUTE PT VISIT 1 Procedure  PT Evaluation  $Initial PT Evaluation Tier I 1 Procedure  PT Treatments  $Gait Training 8-22 mins   09/04/2015  Ouray BingKen Latica Hohmann, PT [phone]  (pager)

## 2015-09-05 NOTE — Progress Notes (Signed)
   08/30/15 1050  OT G-codes **NOT FOR INPATIENT CLASS**  Functional Assessment Tool Used Clinical observation and chart review  Functional Limitation Self care  Self Care Current Status (Z6109(G8987) CI  Self Care Goal Status (U0454(G8988) CI   Late entry for 08/30/15 Ignacia PalmaCathy Ranen Doolin, OTR/L [phone] 09/05/2015

## 2015-09-06 NOTE — Telephone Encounter (Signed)
Close encounter 

## 2015-09-11 NOTE — Progress Notes (Signed)
HPI: FU AVR, CAD and atrial fibrillation. Patient is status post aortic valve replacement as well as coronary artery bypassing graft (SVG to RCA) for aortic stenosis in 2008. Patient had a CVA in March of 2014. TEE 3/14 showed normal LV function, prosthetic aortic valve, moderate MR, mild LAE. Carotid dopplers 5/15 showed 1-39 right and 40-59 LICA. FU recommended in one year. Patient recently admitted with fatigue and bradycardia. Beta blocker discontinued. Echocardiogram November 2016 showed normal LV function, bioprosthetic aortic valve, mild aortic insufficiency and mitral regurgitation and mild biatrial enlargement. Moderate tricuspid regurgitation. Since last seen, He denies dyspnea, chest pain, palpitations or syncope.  Current Outpatient Prescriptions  Medication Sig Dispense Refill  . albuterol (PROVENTIL HFA;VENTOLIN HFA) 108 (90 BASE) MCG/ACT inhaler Inhale 1-2 puffs into the lungs every 6 (six) hours as needed for wheezing or shortness of breath.    . Cholecalciferol 1000 UNITS TBDP Take 1,000 Units by mouth at bedtime.    . donepezil (ARICEPT) 10 MG tablet Take 10 mg by mouth at bedtime.    . gabapentin (NEURONTIN) 300 MG capsule Take 300 mg by mouth at bedtime.     . Magnesium 250 MG TABS Take 250 mg by mouth daily.    . Multiple Vitamin (MULTIVITAMIN) tablet Take 1 tablet by mouth daily.    . simvastatin (ZOCOR) 40 MG tablet Take 1 tablet (40 mg total) by mouth every evening. 30 tablet 6  . sulfaSALAzine (AZULFIDINE) 500 MG tablet Take 500 mg by mouth 2 (two) times daily.     . tamsulosin (FLOMAX) 0.4 MG CAPS Take 0.4 mg by mouth daily after supper.    . warfarin (COUMADIN) 5 MG tablet TAKE AS DIRECTED (Patient taking differently: Take 2.5-5 mg by mouth daily at 6 PM. Takes 5mg  on Tues, Thurs and Sat Takes 2.5mg  all other days) 30 tablet 12   No current facility-administered medications for this visit.     Past Medical History  Diagnosis Date  . Stroke [facility](HCC)     TIA  .  Expressive aphasia   . HTN (hypertension)   . Other and unspecified hyperlipidemia   . BPH (benign prostatic hyperplasia)   . S/P AVR (aortic valve replacement)   . Unspecified sinusitis (chronic)   . CAD (coronary artery disease)   . Atrial fibrillation (HCC)   . Ulcerative colitis [facility](HCC)     Past Surgical History  Procedure Laterality Date  . Cardiac surgery    . Aortic valve repair    . Lung surgery      Thoracotomy for polyps  . Tee without cardioversion N/A 12/08/2012    Procedure: TRANSESOPHAGEAL ECHOCARDIOGRAM (TEE);  Surgeon: Lewayne BuntingBrian S Maclane Holloran, MD;  [facility];  Service: Cardiovascular;  Laterality: N/A;    Social History   Social History  . Marital Status: Married    Spouse Name: N/A  . Number of Children: 2  . Years of Education: N/A   Occupational History  . Not on file.   Social History Main Topics  . Smoking status: Former Smoker    Quit date: 10/07/1970  . Smokeless tobacco: Not on file  . Alcohol Use: Yes     Comment: Rarely  . Drug Use: No  . Sexual Activity: Not on file   Other Topics Concern  . Not on file   Social History Narrative    ROS: no fevers or chills, productive cough, hemoptysis, dysphasia, odynophagia, melena, hematochezia, dysuria, hematuria, rash, seizure activity, orthopnea, PND, pedal edema,  claudication. Remaining systems are negative.  Physical Exam: Well-developed frail in no acute distress.  Skin is warm and dry.  HEENT is normal.  Neck is supple.  Chest is clear to auscultation with normal expansion.  Cardiovascular exam is irregular; 2/6 systolic and diastolic murmur LSB Abdominal exam nontender or distended. No masses palpated. Extremities show no edema. neuro grossly intact

## 2015-09-13 ENCOUNTER — Encounter: Payer: Self-pay | Admitting: Cardiology

## 2015-09-13 ENCOUNTER — Ambulatory Visit (INDEPENDENT_AMBULATORY_CARE_PROVIDER_SITE_OTHER): Payer: Medicare Other | Admitting: Cardiology

## 2015-09-13 VITALS — BP 138/54 | HR 68 | Ht 71.0 in | Wt 171.0 lb

## 2015-09-13 DIAGNOSIS — Z954 Presence of other heart-valve replacement: Secondary | ICD-10-CM

## 2015-09-13 DIAGNOSIS — I2510 Atherosclerotic heart disease of native coronary artery without angina pectoris: Secondary | ICD-10-CM | POA: Diagnosis not present

## 2015-09-13 DIAGNOSIS — Z952 Presence of prosthetic heart valve: Secondary | ICD-10-CM

## 2015-09-13 DIAGNOSIS — I482 Chronic atrial fibrillation: Secondary | ICD-10-CM

## 2015-09-13 DIAGNOSIS — I4821 Permanent atrial fibrillation: Secondary | ICD-10-CM

## 2015-09-13 DIAGNOSIS — I10 Essential (primary) hypertension: Secondary | ICD-10-CM | POA: Diagnosis not present

## 2015-09-13 DIAGNOSIS — I679 Cerebrovascular disease, unspecified: Secondary | ICD-10-CM

## 2015-09-13 DIAGNOSIS — I2583 Coronary atherosclerosis due to lipid rich plaque: Secondary | ICD-10-CM

## 2015-09-13 NOTE — Assessment & Plan Note (Addendum)
We discussed follow-up carotid Dopplers. However given his age and overall medical condition he would likely not be a good candidate for carotid endarterectomy. We will therefore not pursue follow-up studies. Continue statin.

## 2015-09-13 NOTE — Assessment & Plan Note (Signed)
Beta blocker discontinued previously because of bradycardia. Continue Coumadin.

## 2015-09-13 NOTE — Assessment & Plan Note (Signed)
Continue SBE prophylaxis. 

## 2015-09-13 NOTE — Assessment & Plan Note (Signed)
Continue statin. 

## 2015-09-13 NOTE — Assessment & Plan Note (Signed)
Continue statin. Not on aspirin given need for Coumadin. 

## 2015-09-13 NOTE — Assessment & Plan Note (Signed)
Blood pressure controlled. Continue present medications. 

## 2015-09-13 NOTE — Patient Instructions (Signed)
Dr Crenshaw has made no changes today in your current medications or treatment plan.  Your physician recommends that you schedule a follow-up appointment in 6 months. You will receive a reminder letter in the mail two months in advance. If you don't receive a letter, please call our office to schedule the follow-up appointment.  If you need a refill on your cardiac medications before your next appointment, please call your pharmacy. 

## 2015-09-14 ENCOUNTER — Telehealth: Payer: Self-pay | Admitting: Cardiology

## 2015-09-14 NOTE — Telephone Encounter (Signed)
Spoke with Caleb Delgado, referred to dr Loyal Jacobsonmichael kalish

## 2015-09-14 NOTE — Telephone Encounter (Signed)
Requesting additional skilled nursing for 1 more week. You can call and give a verbal order.

## 2015-10-10 ENCOUNTER — Other Ambulatory Visit: Payer: Self-pay

## 2015-10-10 MED ORDER — SIMVASTATIN 40 MG PO TABS: 40.0000 mg | ORAL_TABLET | Freq: Every evening | ORAL | Status: DC

## 2015-10-10 NOTE — Telephone Encounter (Signed)
CAD (coronary artery disease) - Caleb BuntingBrian S Crenshaw, MD at 09/13/2015 2:52 PM     Status: Written Related Problem: CAD (coronary artery disease)   Expand All Collapse All   Continue statin. Not on aspirin given need for Coumadin.       simvastatin (ZOCOR) 40 MG tablet Take 1 tablet (40 mg total) by mouth every evening

## 2016-03-27 ENCOUNTER — Telehealth: Payer: Self-pay | Admitting: Cardiology

## 2016-03-27 NOTE — Telephone Encounter (Signed)
Past week having irregular heartbeat-BP high-yesterday felt better-still asleep right now-wants to know what to do -pls call [phone] or [phone]

## 2016-03-27 NOTE — Telephone Encounter (Signed)
Spoke with pt, his bp has been running 128/66 to 180/60 for the last month. He feels okay, occ bout of atrial fib. They are concerned about his bp. Will forward for dr Jens Somcrenshaw review

## 2016-03-28 MED ORDER — AMLODIPINE BESYLATE 5 MG PO TABS: 5.0000 mg | ORAL_TABLET | Freq: Every day | ORAL | Status: DC

## 2016-03-28 MED ORDER — PRAVASTATIN SODIUM 40 MG PO TABS: 40.0000 mg | ORAL_TABLET | Freq: Every evening | ORAL | Status: DC

## 2016-03-28 NOTE — Telephone Encounter (Signed)
Spoke with pt wife, Aware of dr crenshaw's recommendations.  

## 2016-03-28 NOTE — Telephone Encounter (Signed)
DC zocor; pravachol 40 mg daily; amlodipine 5 mg daily Olga MillersBrian Salle Brandle

## 2016-05-18 ENCOUNTER — Emergency Department (HOSPITAL_BASED_OUTPATIENT_CLINIC_OR_DEPARTMENT_OTHER): Payer: Medicare Other

## 2016-05-18 ENCOUNTER — Encounter (HOSPITAL_BASED_OUTPATIENT_CLINIC_OR_DEPARTMENT_OTHER): Payer: Self-pay | Admitting: Respiratory Therapy

## 2016-05-18 ENCOUNTER — Emergency Department (HOSPITAL_BASED_OUTPATIENT_CLINIC_OR_DEPARTMENT_OTHER)
Admission: EM | Admit: 2016-05-18 | Discharge: 2016-05-18 | Disposition: A | Discharge status: Home / Self Care | Payer: Medicare Other | Attending: Emergency Medicine | Admitting: Emergency Medicine

## 2016-05-18 DIAGNOSIS — R0602 Shortness of breath: Secondary | ICD-10-CM | POA: Diagnosis present

## 2016-05-18 DIAGNOSIS — N183 Chronic kidney disease, stage 3 (moderate): Secondary | ICD-10-CM | POA: Diagnosis not present

## 2016-05-18 DIAGNOSIS — I130 Hypertensive heart and chronic kidney disease with heart failure and stage 1 through stage 4 chronic kidney disease, or unspecified chronic kidney disease: Secondary | ICD-10-CM | POA: Insufficient documentation

## 2016-05-18 DIAGNOSIS — Z87891 Personal history of nicotine dependence: Secondary | ICD-10-CM | POA: Diagnosis not present

## 2016-05-18 DIAGNOSIS — Z79899 Other long term (current) drug therapy: Secondary | ICD-10-CM | POA: Insufficient documentation

## 2016-05-18 DIAGNOSIS — I509 Heart failure, unspecified: Secondary | ICD-10-CM | POA: Diagnosis not present

## 2016-05-18 DIAGNOSIS — R609 Edema, unspecified: Secondary | ICD-10-CM | POA: Insufficient documentation

## 2016-05-18 DIAGNOSIS — I2510 Atherosclerotic heart disease of native coronary artery without angina pectoris: Secondary | ICD-10-CM | POA: Insufficient documentation

## 2016-05-18 LAB — CBC WITH DIFFERENTIAL/PLATELET
BASOS ABS: 0.1 10*3/uL (ref 0.0–0.1)
Basophils Relative: 1 %
EOS ABS: 0.3 10*3/uL (ref 0.0–0.7)
EOS PCT: 4 %
HCT: 34.9 % — ABNORMAL LOW (ref 39.0–52.0)
Hemoglobin: 11.9 g/dL — ABNORMAL LOW (ref 13.0–17.0)
Lymphocytes Relative: 18 %
Lymphs Abs: 1.6 10*3/uL (ref 0.7–4.0)
MCH: 34 pg (ref 26.0–34.0)
MCHC: 34.1 g/dL (ref 30.0–36.0)
MCV: 99.7 fL (ref 78.0–100.0)
MONO ABS: 0.8 10*3/uL (ref 0.1–1.0)
Monocytes Relative: 10 %
Neutro Abs: 5.8 10*3/uL (ref 1.7–7.7)
Neutrophils Relative %: 67 %
PLATELETS: 214 10*3/uL (ref 150–400)
RBC: 3.5 MIL/uL — AB (ref 4.22–5.81)
RDW: 14.8 % (ref 11.5–15.5)
WBC: 8.5 10*3/uL (ref 4.0–10.5)

## 2016-05-18 LAB — BASIC METABOLIC PANEL
ANION GAP: 10 (ref 5–15)
BUN: 24 mg/dL — AB (ref 6–20)
CALCIUM: 9.5 mg/dL (ref 8.9–10.3)
CO2: 23 mmol/L (ref 22–32)
Chloride: 105 mmol/L (ref 101–111)
Creatinine, Ser: 1.25 mg/dL — ABNORMAL HIGH (ref 0.61–1.24)
GFR calc Af Amer: 58 mL/min — ABNORMAL LOW (ref >60)
GFR, EST NON AFRICAN AMERICAN: 50 mL/min — AB (ref >60)
Glucose, Bld: 124 mg/dL — ABNORMAL HIGH (ref 65–99)
POTASSIUM: 4.2 mmol/L (ref 3.5–5.1)
SODIUM: 138 mmol/L (ref 135–145)

## 2016-05-18 LAB — BRAIN NATRIURETIC PEPTIDE: B NATRIURETIC PEPTIDE 5: 1244.9 pg/mL — AB (ref 0.0–100.0)

## 2016-05-18 MED ORDER — POTASSIUM CHLORIDE ER 10 MEQ PO TBCR: 10.0000 meq | EXTENDED_RELEASE_TABLET | Freq: Once | ORAL | 0 refills | Status: DC

## 2016-05-18 MED ORDER — ALBUTEROL SULFATE HFA 108 (90 BASE) MCG/ACT IN AERS
2.0000 | INHALATION_SPRAY | Freq: Once | RESPIRATORY_TRACT | Status: AC
  Administered 2016-05-18: 2 via RESPIRATORY_TRACT
  Filled 2016-05-18: qty 6.7

## 2016-05-18 MED ORDER — FUROSEMIDE 20 MG PO TABS
20.0000 mg | ORAL_TABLET | Freq: Every day | ORAL | Status: DC
  Administered 2016-05-18: 20 mg via ORAL
  Filled 2016-05-18: qty 1

## 2016-05-18 MED ORDER — FUROSEMIDE 20 MG PO TABS: 20.0000 mg | ORAL_TABLET | Freq: Every day | ORAL | 0 refills | Status: DC

## 2016-05-18 NOTE — ED Notes (Signed)
MD at bedside. 

## 2016-05-18 NOTE — ED Notes (Signed)
Patient transported to X-ray 

## 2016-05-18 NOTE — Discharge Instructions (Signed)
Lasix once per day in the morning. Potassium once per day in the morning.  Keep her appointment with your physician on August 24 to recheck your kidney function to ensure you aren't tolerating the Lasix.  Return to ER with any new or worsening symptoms.

## 2016-05-18 NOTE — ED Triage Notes (Signed)
Per  family, Mr Caleb Delgado has been having SOB that has grown worse over past two days. He used his ProAir inhaler today, but no relif

## 2016-05-18 NOTE — ED Provider Notes (Signed)
[facility] Provider Note   CSN: [id number] Arrival date & time: 05/18/16  1638  First Provider Contact:  First MD Initiated Contact with Patient 05/18/16 1722     By signing my name below, I, Vista Mink, attest that this documentation has been prepared under the direction and in the presence of Rolland Porter, MD. Electronically signed, Vista Mink, ED Scribe. 05/18/16. 5:57 PM.   History   Chief Complaint Chief Complaint  Patient presents with  . Shortness of Breath   HPI HPI Comments: Caleb Delgado is a 82 y.o. Male, Afib, CAD, HTN, Stroke, who presents to the Emergency Department complaining of gradually worsening shortness of breath that has been persistent for the past two days. Pt's wife also states that the pt has had associated wheezing and cough over the past couple days. Pt's wife states that the pt has a Hx of similar symptoms intermittently in the past and states that the pt's shortness of breath and wheezing have become more constant over the last two days. Pt was admitted to the hospital in November 2016 for A-fib and was put on bystolic; discontinued shortly after and is not currently taking. Pt's wife states that the pt has used his albuterol inhaler with no relief but states that she believes he may not be inhaling the medication properly. He denies any recent fever.   The history is provided by the patient and the spouse. No language interpreter was used.   Past Medical History:  Diagnosis Date  . Atrial fibrillation (HCC)   . BPH (benign prostatic hyperplasia)   . CAD (coronary artery disease)   . Expressive aphasia   . HTN (hypertension)   . Other and unspecified hyperlipidemia   . S/P AVR (aortic valve replacement)   . Stroke [facility])    TIA  . Ulcerative colitis (HCC)   . Unspecified sinusitis (chronic)     Patient Active Problem List   Diagnosis Date Noted  . CHF (congestive heart failure) (HCC) 08/30/2015  . Acute on chronic diastolic  heart failure (HCC) 40/98/1191  . Bradycardia 08/29/2015  . CKD (chronic kidney disease), stage III 08/29/2015  . Cerebrovascular disease 01/19/2014  . Atrial fibrillation (HCC) 06/02/2013  . CAD (coronary artery disease) 06/02/2013  . Expressive aphasia 12/06/2012  . HTN (hypertension) 12/06/2012  . Other and unspecified hyperlipidemia 12/06/2012  . BPH (benign prostatic hyperplasia) 12/06/2012  . history of TIA 12/06/2012  . S/P AVR (aortic valve replacement) 12/06/2012  . Unspecified sinusitis (chronic) 12/06/2012    Past Surgical History:  Procedure Laterality Date  . AORTIC VALVE REPAIR    . CARDIAC SURGERY    . LUNG SURGERY     Thoracotomy for polyps  . TEE WITHOUT CARDIOVERSION N/A 12/08/2012   Procedure: TRANSESOPHAGEAL ECHOCARDIOGRAM (TEE);  Surgeon: Lewayne Bunting, MD;  Location: [facility] ENDOSCOPY;  Service: Cardiovascular;  Laterality: N/A;     Home Medications    Prior to Admission medications   Medication Sig Start Date End Date Taking? Authorizing Provider  folic acid (FOLVITE) 0.5 MG tablet Take 0.5 mg by mouth daily.   Yes Historical Provider, MD  albuterol (PROVENTIL HFA;VENTOLIN HFA) 108 (90 BASE) MCG/ACT inhaler Inhale 1-2 puffs into the lungs every 6 (six) hours as needed for wheezing or shortness of breath.    Historical Provider, MD  amLODipine (NORVASC) 5 MG tablet Take 1 tablet (5 mg total) by mouth daily. 03/28/16   Lewayne Bunting, MD  Cholecalciferol 1000 UNITS TBDP Take 1,000 Units  by mouth at bedtime.    Historical Provider, MD  donepezil (ARICEPT) 10 MG tablet Take 10 mg by mouth at bedtime.    Historical Provider, MD  furosemide (LASIX) 20 MG tablet Take 1 tablet (20 mg total) by mouth daily. 05/18/16   Rolland Porter, MD  gabapentin (NEURONTIN) 300 MG capsule Take 300 mg by mouth at bedtime.  05/20/13   Historical Provider, MD  Magnesium 250 MG TABS Take 250 mg by mouth daily.    Historical Provider, MD  Multiple Vitamin (MULTIVITAMIN) tablet Take 1 tablet  by mouth daily.    Historical Provider, MD  potassium chloride (K-DUR) 10 MEQ tablet Take 1 tablet (10 mEq total) by mouth once. 05/18/16 05/18/16  Rolland Porter, MD  pravastatin (PRAVACHOL) 40 MG tablet Take 1 tablet (40 mg total) by mouth every evening. 03/28/16   Lewayne Bunting, MD  sulfaSALAzine (AZULFIDINE) 500 MG tablet Take 500 mg by mouth 2 (two) times daily.  01/02/14   Historical Provider, MD  tamsulosin (FLOMAX) 0.4 MG CAPS Take 0.4 mg by mouth daily after supper.    Historical Provider, MD  warfarin (COUMADIN) 5 MG tablet TAKE AS DIRECTED Patient taking differently: Take 2.5-5 mg by mouth daily at 6 PM. Takes 5mg  on Tues, Thurs and Sat Takes 2.5mg  all other days 06/16/14   Lewayne Bunting, MD    Family History Family History  Problem Relation Age of Onset  . Stroke Mother   . Diabetes Mother   . Stroke Father     Social History Social History  Substance Use Topics  . Smoking status: Former Smoker    Quit date: 10/07/1970  . Smokeless tobacco: Not on file  . Alcohol use Yes     Comment: Rarely     Allergies   Tramadol   Review of Systems Review of Systems  Constitutional: Negative for appetite change, chills, diaphoresis, fatigue and fever.  HENT: Negative for mouth sores, sore throat and trouble swallowing.   Eyes: Negative for visual disturbance.  Respiratory: Positive for shortness of breath and wheezing. Negative for cough and chest tightness.   Cardiovascular: Negative for chest pain.  Gastrointestinal: Negative for abdominal distention, abdominal pain, diarrhea, nausea and vomiting.  Endocrine: Negative for polydipsia, polyphagia and polyuria.  Genitourinary: Negative for dysuria, frequency and hematuria.  Musculoskeletal: Negative for gait problem.  Skin: Negative for color change, pallor and rash.  Neurological: Negative for dizziness, syncope, light-headedness and headaches.  Hematological: Does not bruise/bleed easily.  Psychiatric/Behavioral: Negative for  behavioral problems and confusion.  All other systems reviewed and are negative.  Physical Exam Updated Vital Signs BP 158/57   Pulse 73   Temp 98.4 F (36.9 C)   Resp 24   Ht 5' 11.75" (1.822 m)   Wt 173 lb (78.5 kg)   SpO2 93%   BMI 23.63 kg/m   Physical Exam  Constitutional: He is oriented to person, place, and time. He appears well-developed and well-nourished. No distress.  HENT:  Head: Normocephalic.  Eyes: Conjunctivae are normal. Pupils are equal, round, and reactive to light. No scleral icterus.  Neck: Normal range of motion. Neck supple. No thyromegaly present.  Cardiovascular: Normal rate and regular rhythm.  Exam reveals no gallop and no friction rub.   No murmur heard. Pulmonary/Chest: Effort normal. No respiratory distress. He has no wheezes. He has no rales.  Trace crackles at left base.  Abdominal: Soft. Bowel sounds are normal. He exhibits no distension. There is no tenderness. There is  no rebound.  Musculoskeletal: Normal range of motion. He exhibits edema.  Trace symmetric lower extremity edema.  Neurological: He is alert and oriented to person, place, and time.  Skin: Skin is warm and dry. No rash noted.  Psychiatric: He has a normal mood and affect. His behavior is normal.     ED Treatments / Results  DIAGNOSTIC STUDIES: Oxygen Saturation is 96% on RA, normal by my interpretation.  COORDINATION OF CARE: 5:30 PM-Will order blood work. Discussed treatment plan with pt at bedside and pt agreed to plan.   Labs (all labs ordered are listed, but only abnormal results are displayed) Labs Reviewed  CBC WITH DIFFERENTIAL/PLATELET - Abnormal; Notable for the following:       Result Value   RBC 3.50 (*)    Hemoglobin 11.9 (*)    HCT 34.9 (*)    All other components within normal limits  BASIC METABOLIC PANEL - Abnormal; Notable for the following:    Glucose, Bld 124 (*)    BUN 24 (*)    Creatinine, Ser 1.25 (*)    GFR calc non Af Amer 50 (*)    GFR  calc Af Amer 58 (*)    All other components within normal limits  BRAIN NATRIURETIC PEPTIDE - Abnormal; Notable for the following:    B Natriuretic Peptide 1,244.9 (*)    All other components within normal limits    EKG  EKG Interpretation None       Radiology Dg Chest 2 View  Result Date: 05/18/2016 CLINICAL DATA:  Gradually worsening shortness of breath over past 2 days, associated wheezing and coughing, history of stroke, hypertension, coronary artery disease, atrial fibrillation, ulcerative colitis EXAM: CHEST  2 VIEW COMPARISON:  11/03/2015 FINDINGS: Enlargement of cardiac silhouette post CABG. Atherosclerotic calcification aorta. Slight pulmonary vascular congestion. Increased RIGHT pleural effusion and basilar atelectasis. Question loculation of fluid within the RIGHT major fissure. Underlying emphysematous changes with minimal central peribronchial thickening. Remaining lungs clear. No pneumothorax or acute osseous findings. IMPRESSION: Enlargement of cardiac silhouette post CABG. Aortic atherosclerosis. COPD changes with increased RIGHT pleural effusion and basilar atelectasis since prior study. Electronically Signed   By: Ulyses SouthwardMark  Boles M.D.   On: 05/18/2016 18:00    Procedures Procedures (including critical care time)  Medications Ordered in ED Medications  furosemide (LASIX) tablet 20 mg (not administered)  albuterol (PROVENTIL HFA;VENTOLIN HFA) 108 (90 Base) MCG/ACT inhaler 2 puff (2 puffs Inhalation Given 05/18/16 1743)     Initial Impression / Assessment and Plan / ED Course  I have reviewed the triage vital signs and the nursing notes.  Pertinent labs & imaging results that were available during my care of the patient were reviewed by me and considered in my medical decision making (see chart for details).  Clinical Course  Value Comment By Time  DG Chest 2 View (Reviewed) Rolland PorterMark Yon Schiffman, MD 08/12 1911  DG Chest 2 View (Reviewed) Rolland PorterMark Maclane Holloran, MD 08/12 1912    Some  changes consistent with congestive heart failure including ulnar congestion on chest x-ray and elevated BNP. Saturating 93% on room air. Given 20 of Lasix here. Prescription for Lasix and potassium. He has existing appointment with his PCP in 13 days. Return to your with the flu or worsening symptoms. Advised recheck renal function with physician on neck appointment.  Final Clinical Impressions(s) / ED Diagnoses   Final diagnoses:  Congestive heart failure, unspecified congestive heart failure chronicity, unspecified congestive heart failure type (HCC)    New  Prescriptions New Prescriptions   FUROSEMIDE (LASIX) 20 MG TABLET    Take 1 tablet (20 mg total) by mouth daily.   POTASSIUM CHLORIDE (K-DUR) 10 MEQ TABLET    Take 1 tablet (10 mEq total) by mouth once.  I personally performed the services described in this documentation, which was scribed in my presence. The recorded information has been reviewed and is accurate.    Rolland Porter, MD 05/18/16 Jerene Bears

## 2016-06-03 NOTE — Progress Notes (Signed)
HPI: FU AVR, CAD and atrial fibrillation. Patient is status post aortic valve replacement as well as coronary artery bypassing graft (SVG to RCA) for aortic stenosis in 2008. Patient had a CVA in March of 2014. TEE 3/14 showed normal LV function, prosthetic aortic valve, moderate MR, mild LAE. Carotid dopplers 5/15 showed 1-39 right and 40-59 LICA. FU recommended in one year. Echocardiogram November 2016 showed normal LV function, bioprosthetic aortic valve, mild aortic insufficiency and mitral regurgitation and mild biatrial enlargement. Moderate tricuspid regurgitation. Patient seen in the emergency room on August 12 with complaints of dyspnea. BNP 1245 and patient started on Lasix. Since then, He has improved. He now denies dyspnea, chest pain, palpitations or syncope. No pedal edema.  Current Outpatient Prescriptions  Medication Sig Dispense Refill  . albuterol (PROVENTIL HFA;VENTOLIN HFA) 108 (90 BASE) MCG/ACT inhaler Inhale 1-2 puffs into the lungs every 6 (six) hours as needed for wheezing or shortness of breath.    Marland Kitchen amLODipine (NORVASC) 5 MG tablet Take 1 tablet (5 mg total) by mouth daily. 90 tablet 3  . Cholecalciferol 1000 UNITS TBDP Take 1,000 Units by mouth at bedtime.    . donepezil (ARICEPT) 10 MG tablet Take 10 mg by mouth at bedtime.    . folic acid (FOLVITE) 0.5 MG tablet Take 0.5 mg by mouth daily.    . furosemide (LASIX) 20 MG tablet Take 1 tablet (20 mg total) by mouth daily. 30 tablet 0  . gabapentin (NEURONTIN) 300 MG capsule Take 300 mg by mouth at bedtime.     . Magnesium 250 MG TABS Take 250 mg by mouth daily.    . Multiple Vitamin (MULTIVITAMIN) tablet Take 1 tablet by mouth daily.    . pravastatin (PRAVACHOL) 40 MG tablet Take 1 tablet (40 mg total) by mouth every evening. 90 tablet 3  . sulfaSALAzine (AZULFIDINE) 500 MG tablet Take 500 mg by mouth 2 (two) times daily.     . tamsulosin (FLOMAX) 0.4 MG CAPS Take 0.4 mg by mouth daily after supper.    . traZODone  (DESYREL) 50 MG tablet Take 50 mg by mouth at bedtime.    Marland Kitchen warfarin (COUMADIN) 5 MG tablet TAKE AS DIRECTED (Patient taking differently: Take 2.5-5 mg by mouth daily at 6 PM. Takes 5mg  on Tues, Thurs and Sat Takes 2.5mg  all other days) 30 tablet 12  . potassium chloride (K-DUR) 10 MEQ tablet Take 1 tablet (10 mEq total) by mouth once. 30 tablet 0   No current facility-administered medications for this visit.      Past Medical History:  Diagnosis Date  . Atrial fibrillation (HCC)   . BPH (benign prostatic hyperplasia)   . CAD (coronary artery disease)   . Expressive aphasia   . HTN (hypertension)   . Other and unspecified hyperlipidemia   . S/P AVR (aortic valve replacement)   . Stroke [facility])    TIA  . Ulcerative colitis (HCC)   . Unspecified sinusitis (chronic)     Past Surgical History:  Procedure Laterality Date  . AORTIC VALVE REPAIR    . CARDIAC SURGERY    . LUNG SURGERY     Thoracotomy for polyps  . TEE WITHOUT CARDIOVERSION N/A 12/08/2012   Procedure: TRANSESOPHAGEAL ECHOCARDIOGRAM (TEE);  Surgeon: Lewayne Bunting, MD;  Location: [facility] ENDOSCOPY;  Service: Cardiovascular;  Laterality: N/A;    Social History   Social History  . Marital status: Married    Spouse name: N/A  . Number of  children: 2  . Years of education: N/A   Occupational History  . Not on file.   Social History Main Topics  . Smoking status: Former Smoker    Quit date: 10/07/1970  . Smokeless tobacco: Never Used  . Alcohol use Yes     Comment: Rarely  . Drug use: No  . Sexual activity: Not on file   Other Topics Concern  . Not on file   Social History Narrative  . No narrative on file    Family History  Problem Relation Age of Onset  . Stroke Mother   . Diabetes Mother   . Stroke Father     ROS: no fevers or chills, productive cough, hemoptysis, dysphasia, odynophagia, melena, hematochezia, dysuria, hematuria, rash, seizure activity, orthopnea, PND, pedal edema, claudication.  Remaining systems are negative.  Physical Exam: Well-developed frail in no acute distress.  Skin is warm and dry.  HEENT is normal.  Neck is supple.  Chest is clear to auscultation with normal expansion.  Cardiovascular exam is irregular. 2/6 systolic and diastolic murmur left sternal border. Abdominal exam nontender or distended. No masses palpated. Extremities show no edema. neuro grossly intact  A/P May 18, 2016- atrial fibrillation, Left ventricular hypertrophy, nonspecific ST changes  1 Permanent atrial fibrillation-beta blocker discontinued previously because of bradycardia. Continue Coumadin.  2 Coronary artery disease-continue statin. Not on aspirin given need for Coumadin.   3 Cerebrovascular disease-we have elected not to pursue follow-up carotid Dopplers given his age and overall medical condition. Continue statin.  4 hypertension-blood pressure elevated. Increase amlodipine to 10 mg daily.  5 hyperlipidemia-continue statin.  6 status post aortic valve replacement-continue SBE prophylaxis.  7 chronic diastolic congestive heart failure-patient initiated on Lasix during recent ER visit. He is improved. Continue present dose. Check potassium and renal function.  Olga MillersBrian Crenshaw, MD

## 2016-06-05 ENCOUNTER — Other Ambulatory Visit: Payer: Self-pay | Admitting: *Deleted

## 2016-06-05 ENCOUNTER — Encounter: Payer: Self-pay | Admitting: Cardiology

## 2016-06-05 ENCOUNTER — Ambulatory Visit (INDEPENDENT_AMBULATORY_CARE_PROVIDER_SITE_OTHER): Payer: Medicare Other | Admitting: Cardiology

## 2016-06-05 VITALS — BP 157/46 | HR 61 | Ht 71.5 in | Wt 165.1 lb

## 2016-06-05 DIAGNOSIS — I10 Essential (primary) hypertension: Secondary | ICD-10-CM

## 2016-06-05 DIAGNOSIS — E785 Hyperlipidemia, unspecified: Secondary | ICD-10-CM | POA: Diagnosis not present

## 2016-06-05 DIAGNOSIS — Z952 Presence of prosthetic heart valve: Secondary | ICD-10-CM

## 2016-06-05 DIAGNOSIS — Z79899 Other long term (current) drug therapy: Secondary | ICD-10-CM

## 2016-06-05 DIAGNOSIS — I2510 Atherosclerotic heart disease of native coronary artery without angina pectoris: Secondary | ICD-10-CM

## 2016-06-05 DIAGNOSIS — Z954 Presence of other heart-valve replacement: Secondary | ICD-10-CM

## 2016-06-05 MED ORDER — AMLODIPINE BESYLATE 10 MG PO TABS: 10.0000 mg | ORAL_TABLET | Freq: Every day | ORAL | 11 refills | Status: DC

## 2016-06-05 NOTE — Patient Instructions (Signed)
Your physician has recommended you make the following change in your medication:  INCREASE AMLODIPINE TO  10 MG EVERY DAY   Your physician recommends that you return for lab work in: TODAY  BMET  Your physician wants you to follow-up in: 6 MONTHS  WITH DR  Jens SomRENSHAW  You will receive a reminder letter in the mail two months in advance. If you don't receive a letter, please call our office to schedule the follow-up appointment.

## 2016-06-11 ENCOUNTER — Other Ambulatory Visit: Payer: Self-pay

## 2016-06-11 MED ORDER — POTASSIUM CHLORIDE ER 10 MEQ PO TBCR: 10.0000 meq | EXTENDED_RELEASE_TABLET | Freq: Once | ORAL | 3 refills | Status: DC

## 2016-06-11 MED ORDER — FUROSEMIDE 20 MG PO TABS: 20.0000 mg | ORAL_TABLET | Freq: Every day | ORAL | 3 refills | Status: DC

## 2016-06-13 ENCOUNTER — Other Ambulatory Visit: Payer: Self-pay | Admitting: *Deleted

## 2016-06-13 ENCOUNTER — Other Ambulatory Visit: Payer: Self-pay

## 2016-06-13 MED ORDER — POTASSIUM CHLORIDE ER 10 MEQ PO TBCR: 10.0000 meq | EXTENDED_RELEASE_TABLET | Freq: Once | ORAL | 3 refills | Status: DC

## 2016-06-13 MED ORDER — FUROSEMIDE 20 MG PO TABS: 20.0000 mg | ORAL_TABLET | Freq: Every day | ORAL | 3 refills | Status: AC

## 2016-06-13 MED ORDER — FUROSEMIDE 20 MG PO TABS: 20.0000 mg | ORAL_TABLET | Freq: Every day | ORAL | 3 refills | Status: DC

## 2016-06-13 NOTE — Telephone Encounter (Signed)
Patients wife states that she called and requested a refill on these earlier this week but the pharmacy still has not received them.

## 2016-10-03 ENCOUNTER — Emergency Department (HOSPITAL_COMMUNITY)
Admission: EM | Admit: 2016-10-03 | Discharge: 2016-10-03 | Disposition: A | Discharge status: Home / Self Care | Payer: Medicare Other | Attending: Emergency Medicine | Admitting: Emergency Medicine

## 2016-10-03 ENCOUNTER — Emergency Department (HOSPITAL_COMMUNITY): Payer: Medicare Other

## 2016-10-03 ENCOUNTER — Encounter (HOSPITAL_COMMUNITY): Payer: Self-pay

## 2016-10-03 DIAGNOSIS — N183 Chronic kidney disease, stage 3 (moderate): Secondary | ICD-10-CM | POA: Diagnosis not present

## 2016-10-03 DIAGNOSIS — W228XXA Striking against or struck by other objects, initial encounter: Secondary | ICD-10-CM | POA: Diagnosis not present

## 2016-10-03 DIAGNOSIS — I5033 Acute on chronic diastolic (congestive) heart failure: Secondary | ICD-10-CM | POA: Insufficient documentation

## 2016-10-03 DIAGNOSIS — Z7901 Long term (current) use of anticoagulants: Secondary | ICD-10-CM | POA: Diagnosis not present

## 2016-10-03 DIAGNOSIS — Z87891 Personal history of nicotine dependence: Secondary | ICD-10-CM | POA: Diagnosis not present

## 2016-10-03 DIAGNOSIS — S0990XA Unspecified injury of head, initial encounter: Secondary | ICD-10-CM | POA: Diagnosis present

## 2016-10-03 DIAGNOSIS — Y939 Activity, unspecified: Secondary | ICD-10-CM | POA: Insufficient documentation

## 2016-10-03 DIAGNOSIS — Y999 Unspecified external cause status: Secondary | ICD-10-CM | POA: Insufficient documentation

## 2016-10-03 DIAGNOSIS — W19XXXA Unspecified fall, initial encounter: Secondary | ICD-10-CM

## 2016-10-03 DIAGNOSIS — S80212A Abrasion, left knee, initial encounter: Secondary | ICD-10-CM | POA: Insufficient documentation

## 2016-10-03 DIAGNOSIS — Y92009 Unspecified place in unspecified non-institutional (private) residence as the place of occurrence of the external cause: Secondary | ICD-10-CM | POA: Insufficient documentation

## 2016-10-03 DIAGNOSIS — I2510 Atherosclerotic heart disease of native coronary artery without angina pectoris: Secondary | ICD-10-CM | POA: Diagnosis not present

## 2016-10-03 DIAGNOSIS — I130 Hypertensive heart and chronic kidney disease with heart failure and stage 1 through stage 4 chronic kidney disease, or unspecified chronic kidney disease: Secondary | ICD-10-CM | POA: Insufficient documentation

## 2016-10-03 DIAGNOSIS — Z8673 Personal history of transient ischemic attack (TIA), and cerebral infarction without residual deficits: Secondary | ICD-10-CM | POA: Insufficient documentation

## 2016-10-03 NOTE — ED Triage Notes (Signed)
[facility]- pt fell in his closet. He has abrasion to the right hip and to his right buttocks. Pt also hit his head on the right side. Pt takes coumadin. Hx of dementia but is a&o X4. No LOC.

## 2016-10-03 NOTE — ED Notes (Signed)
Pt returned from CT °

## 2016-10-03 NOTE — ED Notes (Signed)
Pt gone to CT 

## 2016-10-03 NOTE — ED Notes (Addendum)
Ambulated pt with walker in hall, no issues. Notified Browning(PA). Pt placed back on monitor.

## 2016-10-03 NOTE — ED Provider Notes (Signed)
[facility]-EMERGENCY DEPT Provider Note   CSN: [id number] Arrival date & time: 10/03/16  1024     History   Chief Complaint Chief Complaint  Patient presents with  . Fall    HPI Caleb Delgado is a 82 y.o. male.  Patient presents to the emergency department with chief complaint of mechanical fall. He lost his balance and fell at home today. He struck his head when he fell. He takes Coumadin. He denies loss of consciousness. Per the family, he has normal baseline mental status. He states that he has been progressively weakening over the past couple of years. This is not an acute weakness. His last tetanus shot is up-to-date. He denies any chest pain, shortness of breath, abdominal pain there are no other associated symptoms. There are no modifying factors.   The history is provided by the patient. No language interpreter was used.    Past Medical History:  Diagnosis Date  . Atrial fibrillation (HCC)   . BPH (benign prostatic hyperplasia)   . CAD (coronary artery disease)   . Expressive aphasia   . HTN (hypertension)   . Other and unspecified hyperlipidemia   . S/P AVR (aortic valve replacement)   . Stroke [facility])    TIA  . Ulcerative colitis (HCC)   . Unspecified sinusitis (chronic)     Patient Active Problem List   Diagnosis Date Noted  . CHF (congestive heart failure) (HCC) 08/30/2015  . Acute on chronic diastolic heart failure (HCC) 08/29/2015  . Bradycardia 08/29/2015  . CKD (chronic kidney disease), stage III 08/29/2015  . Cerebrovascular disease 01/19/2014  . Atrial fibrillation (HCC) 06/02/2013  . CAD (coronary artery disease) 06/02/2013  . Expressive aphasia 12/06/2012  . HTN (hypertension) 12/06/2012  . Other and unspecified hyperlipidemia 12/06/2012  . BPH (benign prostatic hyperplasia) 12/06/2012  . history of TIA 12/06/2012  . S/P AVR (aortic valve replacement) 12/06/2012  . Unspecified sinusitis (chronic) 12/06/2012    Past Surgical History:    Procedure Laterality Date  . AORTIC VALVE REPAIR    . CARDIAC SURGERY    . LUNG SURGERY     Thoracotomy for polyps  . TEE WITHOUT CARDIOVERSION N/A 12/08/2012   Procedure: TRANSESOPHAGEAL ECHOCARDIOGRAM (TEE);  Surgeon: Lewayne Bunting, MD;  Location: [facility] ENDOSCOPY;  Service: Cardiovascular;  Laterality: N/A;       Home Medications    Prior to Admission medications   Medication Sig Start Date End Date Taking? Authorizing Provider  albuterol (PROVENTIL HFA;VENTOLIN HFA) 108 (90 BASE) MCG/ACT inhaler Inhale 1-2 puffs into the lungs every 6 (six) hours as needed for wheezing or shortness of breath.    Historical Provider, MD  amLODipine (NORVASC) 10 MG tablet Take 1 tablet (10 mg total) by mouth daily. 06/05/16   Lewayne Bunting, MD  Cholecalciferol 1000 UNITS TBDP Take 1,000 Units by mouth at bedtime.    Historical Provider, MD  donepezil (ARICEPT) 10 MG tablet Take 10 mg by mouth at bedtime.    Historical Provider, MD  folic acid (FOLVITE) 0.5 MG tablet Take 0.5 mg by mouth daily.    Historical Provider, MD  furosemide (LASIX) 20 MG tablet Take 1 tablet (20 mg total) by mouth daily. 06/13/16   Lewayne Bunting, MD  gabapentin (NEURONTIN) 300 MG capsule Take 300 mg by mouth at bedtime.  05/20/13   Historical Provider, MD  Magnesium 250 MG TABS Take 250 mg by mouth daily.    Historical Provider, MD  Multiple Vitamin (MULTIVITAMIN) tablet Take 1  tablet by mouth daily.    Historical Provider, MD  potassium chloride (K-DUR) 10 MEQ tablet Take 1 tablet (10 mEq total) by mouth once. 06/13/16 06/13/16  Lewayne BuntingBrian S Crenshaw, MD  pravastatin (PRAVACHOL) 40 MG tablet Take 1 tablet (40 mg total) by mouth every evening. 03/28/16   Lewayne BuntingBrian S Crenshaw, MD  sulfaSALAzine (AZULFIDINE) 500 MG tablet Take 500 mg by mouth 2 (two) times daily.  01/02/14   Historical Provider, MD  tamsulosin (FLOMAX) 0.4 MG CAPS Take 0.4 mg by mouth daily after supper.    Historical Provider, MD  traZODone (DESYREL) 50 MG tablet Take 50 mg  by mouth at bedtime.    Historical Provider, MD  warfarin (COUMADIN) 5 MG tablet TAKE AS DIRECTED Patient taking differently: Take 2.5-5 mg by mouth daily at 6 PM. Takes 5mg  on Tues, Thurs and Sat Takes 2.5mg  all other days 06/16/14   Lewayne BuntingBrian S Crenshaw, MD    Family History Family History  Problem Relation Age of Onset  . Stroke Mother   . Diabetes Mother   . Stroke Father     Social History Social History  Substance Use Topics  . Smoking status: Former Smoker    Quit date: 10/07/1970  . Smokeless tobacco: Never Used  . Alcohol use Yes     Comment: Rarely     Allergies   Nebivolol and Tramadol   Review of Systems Review of Systems  All other systems reviewed and are negative.    Physical Exam Updated Vital Signs BP (!) 142/37 (BP Location: Right Arm)   Pulse (!) 56   Temp 97.5 F (36.4 C) (Oral)   Resp 12   SpO2 97%   Physical Exam  Constitutional: He is oriented to person, place, and time. He appears well-developed and well-nourished.  HENT:  Head: Normocephalic and atraumatic.  Right temporal hematoma/contusion  Eyes: Conjunctivae and EOM are normal. Pupils are equal, round, and reactive to light. Right eye exhibits no discharge. Left eye exhibits no discharge. No scleral icterus.  Neck: Normal range of motion. Neck supple. No JVD present.  Cardiovascular: Normal rate, regular rhythm and normal heart sounds.  Exam reveals no gallop and no friction rub.   No murmur heard. Pulmonary/Chest: Effort normal and breath sounds normal. No respiratory distress. He has no wheezes. He has no rales. He exhibits no tenderness.  Abdominal: Soft. He exhibits no distension and no mass. There is no tenderness. There is no rebound and no guarding.  Musculoskeletal: Normal range of motion. He exhibits no edema or tenderness.  Neurological: He is alert and oriented to person, place, and time.  Skin: Skin is warm and dry.  Abrasion to left knee  Psychiatric: He has a normal mood  and affect. His behavior is normal. Judgment and thought content normal.  Nursing note and vitals reviewed.    ED Treatments / Results  Labs (all labs ordered are listed, but only abnormal results are displayed) Labs Reviewed - No data to display  EKG  EKG Interpretation None       Radiology Ct Head Wo Contrast  Result Date: 10/03/2016 CLINICAL DATA:  Fall, hit right side of head.  On blood thinners. EXAM: CT HEAD WITHOUT CONTRAST TECHNIQUE: Contiguous axial images were obtained from the base of the skull through the vertex without intravenous contrast. COMPARISON:  08/29/2015 FINDINGS: Brain: Diffuse cerebral atrophy. No acute intracranial abnormality. Specifically, no hemorrhage, hydrocephalus, mass lesion, acute infarction, or significant intracranial injury. Vascular: No hyperdense vessel or unexpected calcification.  Skull: No acute calvarial abnormality. Sinuses/Orbits: Visualized paranasal sinuses and mastoids clear. Orbital soft tissues unremarkable. Other: None IMPRESSION: Diffuse cerebral atrophy.  No acute intracranial abnormality. Electronically Signed   By: Charlett NoseKevin  Dover M.D.   On: 10/03/2016 10:54   Ct Cervical Spine Wo Contrast  Result Date: 10/03/2016 CLINICAL DATA:  82 year old male with history of trauma from a fall complaining of neck pain. EXAM: CT CERVICAL SPINE WITHOUT CONTRAST TECHNIQUE: Multidetector CT imaging of the cervical spine was performed without intravenous contrast. Multiplanar CT image reconstructions were also generated. COMPARISON:  No priors. FINDINGS: Alignment: Normal. Skull base and vertebrae: No acute fracture. No primary bone lesion or focal pathologic process. Soft tissues and spinal canal: No prevertebral fluid or swelling. No visible canal hematoma. Disc levels: There is multilevel degenerative disc disease, most pronounced at C4-C5, C5-C6, C6-C7 and C7-T1. Multilevel facet arthropathy. Upper chest: Bilateral apical pleuroparenchymal thickening  and calcification, most compatible with chronic post infectious or inflammatory scarring. Other: None. IMPRESSION: 1. No evidence of significant acute traumatic injury to the cervical spine. 2. Multilevel degenerative disc disease and cervical spondylosis, as above. Electronically Signed   By: Trudie Reedaniel  Entrikin M.D.   On: 10/03/2016 12:42    Procedures Procedures (including critical care time)  Medications Ordered in ED Medications - No data to display   Initial Impression / Assessment and Plan / ED Course  I have reviewed the triage vital signs and the nursing notes.  Pertinent labs & imaging results that were available during my care of the patient were reviewed by me and considered in my medical decision making (see chart for details).  Clinical Course     Patient with mechanical fall. CT scan of head and neck are reassuring. Discussed the risk of future bleeding.  Patient ambulates in the emergency department. He is neurovascularly intact.  He is stable and ready for discharge.  Patient seen by and discussed with Dr. Rosalia Hammersay, who agrees with the plan.  Final Clinical Impressions(s) / ED Diagnoses   Final diagnoses:  Fall, initial encounter  Injury of head, initial encounter    New Prescriptions New Prescriptions   No medications on file     Roxy HorsemanRobert Luz Burcher, PA-C 10/03/16 1337    Margarita Grizzleanielle Ray, MD 10/03/16 [phone]

## 2016-10-21 NOTE — Progress Notes (Signed)
HPI: FU AVR, CAD and atrial fibrillation. Patient is status post aortic valve replacement as well as coronary artery bypassing graft (SVG to RCA) for aortic stenosis in 2008. Patient had a CVA in March of 2014. TEE 3/14 showed normal LV function, prosthetic aortic valve, moderate MR, mild LAE. Carotid dopplers 5/15 showed 1-39 right and 40-59 LICA. FU not pursued due to age and overall medical condition. Echocardiogram November 2016 showed normal LV function, bioprosthetic aortic valve, mild aortic insufficiency and mitral regurgitation and mild biatrial enlargement. Moderate tricuspid regurgitation. Since last seen, he denies dyspnea, chest pain, syncope or pedal edema. He has fallen.  Current Outpatient Prescriptions  Medication Sig Dispense Refill  . albuterol (PROVENTIL HFA;VENTOLIN HFA) 108 (90 BASE) MCG/ACT inhaler Inhale 1-2 puffs into the lungs every 6 (six) hours as needed for wheezing or shortness of breath.    Marland Kitchen. amLODipine (NORVASC) 10 MG tablet Take 1 tablet (10 mg total) by mouth daily. 30 tablet 11  . Cholecalciferol 1000 UNITS TBDP Take 1,000 Units by mouth at bedtime.    . donepezil (ARICEPT) 10 MG tablet Take 10 mg by mouth at bedtime.    Marland Kitchen. escitalopram (LEXAPRO) 10 MG tablet Take 10 mg by mouth daily.     . folic acid (FOLVITE) 0.5 MG tablet Take 0.5 mg by mouth daily.    . furosemide (LASIX) 20 MG tablet Take 1 tablet (20 mg total) by mouth daily. 90 tablet 3  . gabapentin (NEURONTIN) 300 MG capsule Take 300 mg by mouth at bedtime.     . Magnesium 250 MG TABS Take 250 mg by mouth daily.    . Multiple Vitamin (MULTIVITAMIN) tablet Take 1 tablet by mouth daily.    . potassium chloride (K-DUR) 10 MEQ tablet Take 1 tablet (10 mEq total) by mouth once. 90 tablet 3  . pravastatin (PRAVACHOL) 40 MG tablet Take 1 tablet (40 mg total) by mouth every evening. 90 tablet 3  . sulfaSALAzine (AZULFIDINE) 500 MG tablet Take 500 mg by mouth 2 (two) times daily.     . tamsulosin (FLOMAX)  0.4 MG CAPS Take 0.4 mg by mouth daily after supper.    . traZODone (DESYREL) 50 MG tablet Take 50 mg by mouth at bedtime.    Marland Kitchen. warfarin (COUMADIN) 5 MG tablet TAKE AS DIRECTED (Patient taking differently: Take 2.5-5 mg by mouth daily at 6 PM. Takes 5mg  on Tues, Thurs and Sat Takes 2.5mg  all other days) 30 tablet 12   No current facility-administered medications for this visit.      Past Medical History:  Diagnosis Date  . Atrial fibrillation (HCC)   . BPH (benign prostatic hyperplasia)   . CAD (coronary artery disease)   . Expressive aphasia   . HTN (hypertension)   . Other and unspecified hyperlipidemia   . S/P AVR (aortic valve replacement)   . Stroke [facility](HCC)    TIA  . Ulcerative colitis (HCC)   . Unspecified sinusitis (chronic)     Past Surgical History:  Procedure Laterality Date  . AORTIC VALVE REPAIR    . CARDIAC SURGERY    . LUNG SURGERY     Thoracotomy for polyps  . TEE WITHOUT CARDIOVERSION N/A 12/08/2012   Procedure: TRANSESOPHAGEAL ECHOCARDIOGRAM (TEE);  Surgeon: Lewayne BuntingBrian S Crenshaw, MD;  [facility];  Service: Cardiovascular;  Laterality: N/A;    Social History   Social History  . Marital status: Married    Spouse name: N/A  . Number of children:  2  . Years of education: N/A   Occupational History  . Not on file.   Social History Main Topics  . Smoking status: Former Smoker    Quit date: 10/07/1970  . Smokeless tobacco: Never Used  . Alcohol use Yes     Comment: Rarely  . Drug use: No  . Sexual activity: Not on file   Other Topics Concern  . Not on file   Social History Narrative  . No narrative on file    Family History  Problem Relation Age of Onset  . Stroke Mother   . Diabetes Mother   . Stroke Father     ROS: no fevers or chills, productive cough, hemoptysis, dysphasia, odynophagia, melena, hematochezia, dysuria, hematuria, rash, seizure activity, orthopnea, PND, pedal edema, claudication. Remaining systems are  negative.  Physical Exam: Well-developed frail in no acute distress.  Skin is warm and dry.  HEENT is normal.  Neck is supple.  Chest is clear to auscultation with normal expansion.  Cardiovascular exam is regular rate and rhythm. 2/6 systolic murmur Abdominal exam nontender or distended. No masses palpated. Extremities show trace edema. neuro grossly intact  ECG-sinus rhythm with first-degree AV block. Nonspecific ST changes.  A/P  1 permanent atrial fibrillation-heart rate is controlled on no medications. Continue Coumadin. Patient has fallen and he is higher risk for Coumadin. However he has had prior CVA and history of atrial fibrillation. I'm hesitant to discontinue anticoagulation for that reason. Instructed to continue to use his walker. Check CBC.  2 coronary artery disease-continue statin. No aspirin given need for Coumadin.  3 carotid artery disease-continue statin. We have elected previously to not pursue further carotid Dopplers given his age and overall medical condition. He would not be a good candidate for intervention.  4 hypertension-blood pressure controlled. Continue present medications.  5 hyperlipidemia-continue statin.  6 aortic valve replacement-continue SBE prophylaxis.  7 chronic diastolic congestive heart failure-continue present dose of Lasix. Check K and renal function.   Olga Millers, MD

## 2016-10-30 ENCOUNTER — Ambulatory Visit (INDEPENDENT_AMBULATORY_CARE_PROVIDER_SITE_OTHER): Payer: Medicare Other | Admitting: Cardiology

## 2016-10-30 ENCOUNTER — Encounter: Payer: Self-pay | Admitting: Cardiology

## 2016-10-30 VITALS — BP 132/48 | HR 60 | Ht 71.5 in | Wt 163.0 lb

## 2016-10-30 DIAGNOSIS — Z952 Presence of prosthetic heart valve: Secondary | ICD-10-CM | POA: Diagnosis not present

## 2016-10-30 DIAGNOSIS — I10 Essential (primary) hypertension: Secondary | ICD-10-CM | POA: Diagnosis not present

## 2016-10-30 DIAGNOSIS — I2510 Atherosclerotic heart disease of native coronary artery without angina pectoris: Secondary | ICD-10-CM

## 2016-10-30 DIAGNOSIS — I4891 Unspecified atrial fibrillation: Secondary | ICD-10-CM

## 2016-10-30 NOTE — Patient Instructions (Signed)

## 2016-10-31 LAB — CBC
HCT: 31.5 % — ABNORMAL LOW (ref 38.5–50.0)
Hemoglobin: 10.4 g/dL — ABNORMAL LOW (ref 13.2–17.1)
MCH: 32.9 pg (ref 27.0–33.0)
MCHC: 33 g/dL (ref 32.0–36.0)
MCV: 99.7 fL (ref 80.0–100.0)
MPV: 9.9 fL (ref 7.5–12.5)
PLATELETS: 139 10*3/uL — AB (ref 140–400)
RBC: 3.16 MIL/uL — ABNORMAL LOW (ref 4.20–5.80)
RDW: 14.4 % (ref 11.0–15.0)
WBC: 5.8 10*3/uL (ref 3.8–10.8)

## 2016-10-31 LAB — BASIC METABOLIC PANEL
BUN: 25 mg/dL (ref 7–25)
CALCIUM: 9.2 mg/dL (ref 8.6–10.3)
CO2: 29 mmol/L (ref 20–31)
Chloride: 102 mmol/L (ref 98–110)
Creat: 1.28 mg/dL — ABNORMAL HIGH (ref 0.70–1.11)
Glucose, Bld: 104 mg/dL — ABNORMAL HIGH (ref 65–99)
Potassium: 4.4 mmol/L (ref 3.5–5.3)
SODIUM: 139 mmol/L (ref 135–146)

## 2016-11-10 ENCOUNTER — Emergency Department (HOSPITAL_COMMUNITY): Payer: Medicare Other

## 2016-11-10 ENCOUNTER — Encounter (HOSPITAL_COMMUNITY): Payer: Self-pay | Admitting: Emergency Medicine

## 2016-11-10 ENCOUNTER — Emergency Department (HOSPITAL_COMMUNITY)
Admission: EM | Admit: 2016-11-10 | Discharge: 2016-11-10 | Disposition: A | Discharge status: Home / Self Care | Payer: Medicare Other | Attending: Emergency Medicine | Admitting: Emergency Medicine

## 2016-11-10 DIAGNOSIS — S0081XA Abrasion of other part of head, initial encounter: Secondary | ICD-10-CM | POA: Diagnosis not present

## 2016-11-10 DIAGNOSIS — I129 Hypertensive chronic kidney disease with stage 1 through stage 4 chronic kidney disease, or unspecified chronic kidney disease: Secondary | ICD-10-CM | POA: Insufficient documentation

## 2016-11-10 DIAGNOSIS — Y939 Activity, unspecified: Secondary | ICD-10-CM | POA: Insufficient documentation

## 2016-11-10 DIAGNOSIS — Z7901 Long term (current) use of anticoagulants: Secondary | ICD-10-CM | POA: Diagnosis not present

## 2016-11-10 DIAGNOSIS — W1830XA Fall on same level, unspecified, initial encounter: Secondary | ICD-10-CM | POA: Insufficient documentation

## 2016-11-10 DIAGNOSIS — I2510 Atherosclerotic heart disease of native coronary artery without angina pectoris: Secondary | ICD-10-CM | POA: Insufficient documentation

## 2016-11-10 DIAGNOSIS — W19XXXA Unspecified fall, initial encounter: Secondary | ICD-10-CM

## 2016-11-10 DIAGNOSIS — Z8673 Personal history of transient ischemic attack (TIA), and cerebral infarction without residual deficits: Secondary | ICD-10-CM | POA: Diagnosis not present

## 2016-11-10 DIAGNOSIS — Z87891 Personal history of nicotine dependence: Secondary | ICD-10-CM | POA: Insufficient documentation

## 2016-11-10 DIAGNOSIS — Y999 Unspecified external cause status: Secondary | ICD-10-CM | POA: Insufficient documentation

## 2016-11-10 DIAGNOSIS — N183 Chronic kidney disease, stage 3 (moderate): Secondary | ICD-10-CM | POA: Diagnosis not present

## 2016-11-10 DIAGNOSIS — Z79899 Other long term (current) drug therapy: Secondary | ICD-10-CM | POA: Diagnosis not present

## 2016-11-10 DIAGNOSIS — S0990XA Unspecified injury of head, initial encounter: Secondary | ICD-10-CM | POA: Diagnosis present

## 2016-11-10 DIAGNOSIS — Y92009 Unspecified place in unspecified non-institutional (private) residence as the place of occurrence of the external cause: Secondary | ICD-10-CM | POA: Diagnosis not present

## 2016-11-10 LAB — PROTIME-INR
INR: 2.21
Prothrombin Time: 24.9 seconds — ABNORMAL HIGH (ref 11.4–15.2)

## 2016-11-10 LAB — CBC WITH DIFFERENTIAL/PLATELET
BASOS PCT: 1 %
Basophils Absolute: 0.1 10*3/uL (ref 0.0–0.1)
EOS ABS: 0.1 10*3/uL (ref 0.0–0.7)
Eosinophils Relative: 2 %
HCT: 31.7 % — ABNORMAL LOW (ref 39.0–52.0)
HEMOGLOBIN: 10.3 g/dL — AB (ref 13.0–17.0)
LYMPHS ABS: 0.7 10*3/uL (ref 0.7–4.0)
Lymphocytes Relative: 8 %
MCH: 32.3 pg (ref 26.0–34.0)
MCHC: 32.5 g/dL (ref 30.0–36.0)
MCV: 99.4 fL (ref 78.0–100.0)
Monocytes Absolute: 0.7 10*3/uL (ref 0.1–1.0)
Monocytes Relative: 8 %
NEUTROS PCT: 83 %
Neutro Abs: 7.5 10*3/uL (ref 1.7–7.7)
Platelets: 145 10*3/uL — ABNORMAL LOW (ref 150–400)
RBC: 3.19 MIL/uL — ABNORMAL LOW (ref 4.22–5.81)
RDW: 14.3 % (ref 11.5–15.5)
WBC: 9.1 10*3/uL (ref 4.0–10.5)

## 2016-11-10 LAB — URINALYSIS, ROUTINE W REFLEX MICROSCOPIC
BACTERIA UA: NONE SEEN
BILIRUBIN URINE: NEGATIVE
Glucose, UA: NEGATIVE mg/dL
Hgb urine dipstick: NEGATIVE
Ketones, ur: NEGATIVE mg/dL
LEUKOCYTES UA: NEGATIVE
Nitrite: NEGATIVE
Protein, ur: 30 mg/dL — AB
SPECIFIC GRAVITY, URINE: 1.019 (ref 1.005–1.030)
Squamous Epithelial / LPF: NONE SEEN
pH: 6 (ref 5.0–8.0)

## 2016-11-10 LAB — COMPREHENSIVE METABOLIC PANEL
ALBUMIN: 3.9 g/dL (ref 3.5–5.0)
ALK PHOS: 73 U/L (ref 38–126)
ALT: 21 U/L (ref 17–63)
AST: 41 U/L (ref 15–41)
Anion gap: 9 (ref 5–15)
BUN: 24 mg/dL — ABNORMAL HIGH (ref 6–20)
CALCIUM: 9.5 mg/dL (ref 8.9–10.3)
CO2: 25 mmol/L (ref 22–32)
CREATININE: 1.37 mg/dL — AB (ref 0.61–1.24)
Chloride: 103 mmol/L (ref 101–111)
GFR calc Af Amer: 51 mL/min — ABNORMAL LOW (ref >60)
GFR calc non Af Amer: 44 mL/min — ABNORMAL LOW (ref >60)
GLUCOSE: 108 mg/dL — AB (ref 65–99)
Potassium: 4.3 mmol/L (ref 3.5–5.1)
SODIUM: 137 mmol/L (ref 135–145)
Total Bilirubin: 1.1 mg/dL (ref 0.3–1.2)
Total Protein: 7.3 g/dL (ref 6.5–8.1)

## 2016-11-10 LAB — TROPONIN I: Troponin I: 0.03 ng/mL (ref <0.03)

## 2016-11-10 MED ORDER — ACETAMINOPHEN 325 MG PO TABS
650.0000 mg | ORAL_TABLET | Freq: Once | ORAL | Status: AC
  Administered 2016-11-10: 650 mg via ORAL
  Filled 2016-11-10: qty 2

## 2016-11-10 MED ORDER — BACITRACIN ZINC 500 UNIT/GM EX OINT
1.0000 "application " | TOPICAL_OINTMENT | Freq: Once | CUTANEOUS | Status: AC
  Administered 2016-11-10: 1 via TOPICAL
  Filled 2016-11-10: qty 0.9

## 2016-11-10 MED ORDER — TETANUS-DIPHTH-ACELL PERTUSSIS 5-2.5-18.5 LF-MCG/0.5 IM SUSP
0.5000 mL | Freq: Once | INTRAMUSCULAR | Status: AC
  Administered 2016-11-10: 0.5 mL via INTRAMUSCULAR
  Filled 2016-11-10: qty 0.5

## 2016-11-10 MED ORDER — TETANUS-DIPHTH-ACELL PERTUSSIS 5-2.5-18.5 LF-MCG/0.5 IM SUSP: 0.5000 mL | Freq: Once | INTRAMUSCULAR | Status: DC

## 2016-11-10 NOTE — Discharge Instructions (Signed)
Is important that you keep a close eye on Caleb Delgado over the next several weeks. Although the CAT scan today was normal, a brain bleed can start at any time after a fall especially when you aren't taking anticoagulation.  Hesitate to return to the emergency department for slurred speech, weakness, confusion or any other concerning symptom.

## 2016-11-10 NOTE — ED Notes (Signed)
Applied bacitracin and gauze to pt's abrasion to head.

## 2016-11-10 NOTE — ED Notes (Signed)
Patient taken to CT.

## 2016-11-10 NOTE — ED Notes (Signed)
CRITICAL VALUE ALERT  Critical value received:  Trop 0.03  Date of notification:  11/10/2016  Time of notification: 0803  Critical value read back:Yes.    Nurse who received alert:  Heide GuileHope Oyindamola Key  MD notified (1st page):  Dr. Particia Nearinghaviland   Time of first page:  0808  No further orders at this time

## 2016-11-10 NOTE — ED Notes (Signed)
Pt unable to sign signature pad. Pt and family agreeable to discharge and understand dc instructions.

## 2016-11-10 NOTE — ED Triage Notes (Signed)
Brought by ems from home after bending over to pick something up and falling head first into the wall.  Abrasion noted to top of head.  Pt is on blood thinners.  Moderate amount of dried blood on face on arrival.  Unsure of any LOC.  Pt is demented.

## 2016-11-10 NOTE — ED Provider Notes (Signed)
[facility]-EMERGENCY DEPT Provider Note   CSN: [id number] Arrival date & time: 11/10/16  0602     History   Chief Complaint Chief Complaint  Patient presents with  . Fall    HPI   Blood pressure (!) 116/37, pulse (!) 52, temperature 97.7 F (36.5 C), temperature source Oral, resp. rate 15, height 5' 11.5" (1.816 m), weight 73.9 kg, SpO2 93 %.  Caleb Delgado is a 83 y.o. male with past medical history significant for atrial fibrillation, aortic valve replacement, CAD complaining of possible fall at home. This was unwitnessed, he is accompanied by his son who supplies most of the history. He states his father frequently falls, he gets up every day like clockwork at 4:30 to use the restroom. His wife was sleeping in a different room last night and they heard a thud and found him on the ground. It is unlikely that he was on the ground for an extended period of time. Patient with history of dementia, cannot really supply history. Level V caveat. He endorses pain on the crown of his head where there is an abrasion. Last tetanus shot is unknown. He denies any chest pain, shortness of breath, abdominal pain or pain in the joints. He takes unknown blood thinner. Unclear if there was loss of consciousness.  Past Medical History:  Diagnosis Date  . Atrial fibrillation (HCC)   . BPH (benign prostatic hyperplasia)   . CAD (coronary artery disease)   . Expressive aphasia   . HTN (hypertension)   . Other and unspecified hyperlipidemia   . S/P AVR (aortic valve replacement)   . Stroke [facility])    TIA  . Ulcerative colitis (HCC)   . Unspecified sinusitis (chronic)     Patient Active Problem List   Diagnosis Date Noted  . CHF (congestive heart failure) (HCC) 08/30/2015  . Acute on chronic diastolic heart failure (HCC) 08/29/2015  . Bradycardia 08/29/2015  . CKD (chronic kidney disease), stage III 08/29/2015  . Cerebrovascular disease 01/19/2014  . Atrial fibrillation (HCC) 06/02/2013  . CAD  (coronary artery disease) 06/02/2013  . Expressive aphasia 12/06/2012  . HTN (hypertension) 12/06/2012  . Other and unspecified hyperlipidemia 12/06/2012  . BPH (benign prostatic hyperplasia) 12/06/2012  . history of TIA 12/06/2012  . S/P AVR (aortic valve replacement) 12/06/2012  . Unspecified sinusitis (chronic) 12/06/2012    Past Surgical History:  Procedure Laterality Date  . AORTIC VALVE REPAIR    . CARDIAC SURGERY    . LUNG SURGERY     Thoracotomy for polyps  . TEE WITHOUT CARDIOVERSION N/A 12/08/2012   Procedure: TRANSESOPHAGEAL ECHOCARDIOGRAM (TEE);  Surgeon: Lewayne Bunting, MD;  Location: [facility] ENDOSCOPY;  Service: Cardiovascular;  Laterality: N/A;       Home Medications    Prior to Admission medications   Medication Sig Start Date End Date Taking? Authorizing Provider  albuterol (PROVENTIL HFA;VENTOLIN HFA) 108 (90 BASE) MCG/ACT inhaler Inhale 1-2 puffs into the lungs every 6 (six) hours as needed for wheezing or shortness of breath.    Historical Provider, MD  amLODipine (NORVASC) 10 MG tablet Take 1 tablet (10 mg total) by mouth daily. 06/05/16   Lewayne Bunting, MD  Cholecalciferol 1000 UNITS TBDP Take 1,000 Units by mouth at bedtime.    Historical Provider, MD  donepezil (ARICEPT) 10 MG tablet Take 10 mg by mouth at bedtime.    Historical Provider, MD  escitalopram (LEXAPRO) 10 MG tablet Take 10 mg by mouth daily.  10/19/16   Historical  Provider, MD  folic acid (FOLVITE) 0.5 MG tablet Take 0.5 mg by mouth daily.    Historical Provider, MD  furosemide (LASIX) 20 MG tablet Take 1 tablet (20 mg total) by mouth daily. 06/13/16   Lewayne BuntingBrian S Crenshaw, MD  gabapentin (NEURONTIN) 300 MG capsule Take 300 mg by mouth at bedtime.  05/20/13   Historical Provider, MD  Magnesium 250 MG TABS Take 250 mg by mouth daily.    Historical Provider, MD  Multiple Vitamin (MULTIVITAMIN) tablet Take 1 tablet by mouth daily.    Historical Provider, MD  potassium chloride (K-DUR) 10 MEQ tablet Take 1  tablet (10 mEq total) by mouth once. 06/13/16 10/30/16  Lewayne BuntingBrian S Crenshaw, MD  pravastatin (PRAVACHOL) 40 MG tablet Take 1 tablet (40 mg total) by mouth every evening. 03/28/16   Lewayne BuntingBrian S Crenshaw, MD  sulfaSALAzine (AZULFIDINE) 500 MG tablet Take 500 mg by mouth 2 (two) times daily.  01/02/14   Historical Provider, MD  tamsulosin (FLOMAX) 0.4 MG CAPS Take 0.4 mg by mouth daily after supper.    Historical Provider, MD  traZODone (DESYREL) 50 MG tablet Take 50 mg by mouth at bedtime.    Historical Provider, MD  warfarin (COUMADIN) 5 MG tablet TAKE AS DIRECTED Patient taking differently: Take 2.5-5 mg by mouth daily at 6 PM. Takes 5mg  on Tues, Thurs and Sat Takes 2.5mg  all other days 06/16/14   Lewayne BuntingBrian S Crenshaw, MD    Family History Family History  Problem Relation Age of Onset  . Stroke Mother   . Diabetes Mother   . Stroke Father     Social History Social History  Substance Use Topics  . Smoking status: Former Smoker    Quit date: 10/07/1970  . Smokeless tobacco: Never Used  . Alcohol use Yes     Comment: Rarely     Allergies   Nebivolol and Tramadol   Review of Systems Review of Systems  10 systems reviewed and found to be negative, except as noted in the HPI.   Physical Exam Updated Vital Signs BP (!) 116/37 (BP Location: Left Arm)   Pulse (!) 52   Temp 97.7 F (36.5 C) (Oral)   Resp 15   Ht 5' 11.5" (1.816 m)   Wt 73.9 kg   SpO2 93%   BMI 22.42 kg/m   Physical Exam  Constitutional: He appears well-developed and well-nourished. No distress.  HENT:  Head: Normocephalic.  Mouth/Throat: Oropharynx is clear and moist.  Abrasion to the crown of head, bleeding controlled, no underlying crepitance.  Eyes: Conjunctivae are normal.  Neck: Normal range of motion. No JVD present. No tracheal deviation present.  Cardiovascular: Normal rate, regular rhythm and intact distal pulses.   Pulmonary/Chest: Effort normal and breath sounds normal. No stridor. No respiratory distress.  He has no wheezes. He has no rales. He exhibits no tenderness.  Abdominal: Soft. He exhibits no distension and no mass. There is no tenderness. There is no rebound and no guarding.  Musculoskeletal: Normal range of motion. He exhibits no edema or tenderness.  Neurological: He is alert.  Confused and slightly confabulating, at his mental baseline as per his son.  Skin: Skin is warm. He is not diaphoretic.  Psychiatric: He has a normal mood and affect.  Nursing note and vitals reviewed.    ED Treatments / Results  Labs (all labs ordered are listed, but only abnormal results are displayed) Labs Reviewed  URINALYSIS, ROUTINE W REFLEX MICROSCOPIC - Abnormal; Notable for the following:  Result Value   Protein, ur 30 (*)    All other components within normal limits  CBC WITH DIFFERENTIAL/PLATELET - Abnormal; Notable for the following:    RBC 3.19 (*)    Hemoglobin 10.3 (*)    HCT 31.7 (*)    Platelets 145 (*)    All other components within normal limits  COMPREHENSIVE METABOLIC PANEL - Abnormal; Notable for the following:    Glucose, Bld 108 (*)    BUN 24 (*)    Creatinine, Ser 1.37 (*)    GFR calc non Af Amer 44 (*)    GFR calc Af Amer 51 (*)    All other components within normal limits  PROTIME-INR - Abnormal; Notable for the following:    Prothrombin Time 24.9 (*)    All other components within normal limits  TROPONIN I - Abnormal; Notable for the following:    Troponin I 0.03 (*)    All other components within normal limits    EKG  EKG Interpretation None       Radiology Ct Head Wo Contrast  Result Date: 11/10/2016 CLINICAL DATA:  Head injury and neck pain after fall in bathroom. EXAM: CT HEAD WITHOUT CONTRAST CT CERVICAL SPINE WITHOUT CONTRAST TECHNIQUE: Multidetector CT imaging of the head and cervical spine was performed following the standard protocol without intravenous contrast. Multiplanar CT image reconstructions of the cervical spine were also generated.  COMPARISON:  CT scan of October 03, 2016. FINDINGS: CT HEAD FINDINGS Brain: Mild diffuse cortical atrophy is noted. No mass effect or midline shift is noted. Ventricular size is within normal limits. There is no evidence of mass lesion, hemorrhage or acute infarction. Vascular: Atherosclerosis of carotid siphons is noted. Skull: Normal. Negative for fracture or focal lesion. Sinuses/Orbits: No acute finding. Other: None. CT CERVICAL SPINE FINDINGS Alignment: Normal. Skull base and vertebrae: No acute fracture. No primary bone lesion or focal pathologic process. Soft tissues and spinal canal: No prevertebral fluid or swelling. No visible canal hematoma. Disc levels: Severe degenerative disc disease is noted at C4-5, C5-6, C6-7 and C7-T1 with anterior osteophyte formation. Upper chest: Calcified pleural plaques are seen in both lung apices. Other: Degenerative changes of posterior facet joints is noted primarily on the left side. IMPRESSION: Mild diffuse cortical atrophy. No acute intracranial abnormality seen. Severe multilevel degenerative disc disease. No acute abnormality seen in the cervical spine. Electronically Signed   By: Lupita Raider, M.D.   On: 11/10/2016 07:43   Ct Cervical Spine Wo Contrast  Result Date: 11/10/2016 CLINICAL DATA:  Head injury and neck pain after fall in bathroom. EXAM: CT HEAD WITHOUT CONTRAST CT CERVICAL SPINE WITHOUT CONTRAST TECHNIQUE: Multidetector CT imaging of the head and cervical spine was performed following the standard protocol without intravenous contrast. Multiplanar CT image reconstructions of the cervical spine were also generated. COMPARISON:  CT scan of October 03, 2016. FINDINGS: CT HEAD FINDINGS Brain: Mild diffuse cortical atrophy is noted. No mass effect or midline shift is noted. Ventricular size is within normal limits. There is no evidence of mass lesion, hemorrhage or acute infarction. Vascular: Atherosclerosis of carotid siphons is noted. Skull: Normal.  Negative for fracture or focal lesion. Sinuses/Orbits: No acute finding. Other: None. CT CERVICAL SPINE FINDINGS Alignment: Normal. Skull base and vertebrae: No acute fracture. No primary bone lesion or focal pathologic process. Soft tissues and spinal canal: No prevertebral fluid or swelling. No visible canal hematoma. Disc levels: Severe degenerative disc disease is noted at C4-5, C5-6, C6-7  and C7-T1 with anterior osteophyte formation. Upper chest: Calcified pleural plaques are seen in both lung apices. Other: Degenerative changes of posterior facet joints is noted primarily on the left side. IMPRESSION: Mild diffuse cortical atrophy. No acute intracranial abnormality seen. Severe multilevel degenerative disc disease. No acute abnormality seen in the cervical spine. Electronically Signed   By: Lupita Raider, M.D.   On: 11/10/2016 07:43    Procedures Procedures (including critical care time)  Medications Ordered in ED Medications  Tdap (BOOSTRIX) injection 0.5 mL (0.5 mLs Intramuscular Not Given 11/10/16 0720)  Tdap (BOOSTRIX) injection 0.5 mL (0.5 mLs Intramuscular Given 11/10/16 0718)  acetaminophen (TYLENOL) tablet 650 mg (650 mg Oral Given 11/10/16 0718)  bacitracin ointment 1 application (1 application Topical Given 11/10/16 0719)     Initial Impression / Assessment and Plan / ED Course  I have reviewed the triage vital signs and the nursing notes.  Pertinent labs & imaging results that were available during my care of the patient were reviewed by me and considered in my medical decision making (see chart for details).     Vitals:   11/10/16 0607 11/10/16 0610 11/10/16 0615 11/10/16 0854  BP:   (!) 153/39 (!) 116/37  Pulse:   (!) 52 (!) 52  Resp:   24 15  Temp:   97.8 F (36.6 C) 97.7 F (36.5 C)  TempSrc:   Oral Oral  SpO2:  98% 96% 93%  Weight: 73.9 kg     Height: 5' 11.5" (1.816 m)       Medications  Tdap (BOOSTRIX) injection 0.5 mL (0.5 mLs Intramuscular Not Given 11/10/16  0720)  Tdap (BOOSTRIX) injection 0.5 mL (0.5 mLs Intramuscular Given 11/10/16 0718)  acetaminophen (TYLENOL) tablet 650 mg (650 mg Oral Given 11/10/16 0718)  bacitracin ointment 1 application (1 application Topical Given 11/10/16 0719)    Caleb Delgado is 83 y.o. male presenting with Fall versus syncope at home, he is accompanied by his son who supplies most of the history is he is demented. Likely he got up to use the restroom, no one was in the room and he had a mechanical fall, he is prone to falling. He does have head trauma, he is anticoagulated. Neurologic exam is grossly nonfocal and he is mentating at his baseline as per his son. Head and cervical CT negative for acute findings.  Troponin leak consistent with baseline, has history of chronic renal failure. Creatinine today is not atypical for his baseline. Discussed with attending who agrees he is stable for discharge.  This is a shared visit with the attending physician who personally evaluated the patient and agrees with the care plan.   Evaluation does not show pathology that would require ongoing emergent intervention or inpatient treatment. Pt is hemodynamically stable and mentating appropriately. Discussed findings and plan with patient/guardian, who agrees with care plan. All questions answered. Return precautions discussed and outpatient follow up given.   Final Clinical Impressions(s) / ED Diagnoses   Final diagnoses:  Fall, initial encounter  Chronic anticoagulation    New Prescriptions Discharge Medication List as of 11/10/2016  8:47 AM       Wynetta Emery, PA-C 11/10/16 1610    Jacalyn Lefevre, MD 11/10/16 [phone]

## 2017-03-16 ENCOUNTER — Other Ambulatory Visit: Payer: Self-pay | Admitting: Cardiology

## 2017-04-18 ENCOUNTER — Other Ambulatory Visit: Payer: Self-pay | Admitting: Cardiology

## 2017-04-20 ENCOUNTER — Emergency Department (HOSPITAL_BASED_OUTPATIENT_CLINIC_OR_DEPARTMENT_OTHER): Payer: Medicare Other

## 2017-04-20 ENCOUNTER — Encounter (HOSPITAL_BASED_OUTPATIENT_CLINIC_OR_DEPARTMENT_OTHER): Payer: Self-pay | Admitting: Emergency Medicine

## 2017-04-20 ENCOUNTER — Inpatient Hospital Stay (HOSPITAL_BASED_OUTPATIENT_CLINIC_OR_DEPARTMENT_OTHER)
Admission: EM | Admit: 2017-04-20 | Discharge: 2017-04-26 | DRG: 871 | Disposition: A | Discharge status: Home / Self Care | Payer: Medicare Other | Attending: Internal Medicine | Admitting: Internal Medicine

## 2017-04-20 DIAGNOSIS — I2510 Atherosclerotic heart disease of native coronary artery without angina pectoris: Secondary | ICD-10-CM | POA: Diagnosis present

## 2017-04-20 DIAGNOSIS — I4891 Unspecified atrial fibrillation: Secondary | ICD-10-CM | POA: Diagnosis present

## 2017-04-20 DIAGNOSIS — J9601 Acute respiratory failure with hypoxia: Secondary | ICD-10-CM | POA: Diagnosis not present

## 2017-04-20 DIAGNOSIS — Z951 Presence of aortocoronary bypass graft: Secondary | ICD-10-CM | POA: Diagnosis not present

## 2017-04-20 DIAGNOSIS — N183 Chronic kidney disease, stage 3 unspecified: Secondary | ICD-10-CM | POA: Diagnosis present

## 2017-04-20 DIAGNOSIS — N400 Enlarged prostate without lower urinary tract symptoms: Secondary | ICD-10-CM | POA: Diagnosis present

## 2017-04-20 DIAGNOSIS — Z833 Family history of diabetes mellitus: Secondary | ICD-10-CM | POA: Diagnosis not present

## 2017-04-20 DIAGNOSIS — I69320 Aphasia following cerebral infarction: Secondary | ICD-10-CM

## 2017-04-20 DIAGNOSIS — Z952 Presence of prosthetic heart valve: Secondary | ICD-10-CM | POA: Diagnosis not present

## 2017-04-20 DIAGNOSIS — J189 Pneumonia, unspecified organism: Secondary | ICD-10-CM | POA: Diagnosis not present

## 2017-04-20 DIAGNOSIS — R110 Nausea: Secondary | ICD-10-CM

## 2017-04-20 DIAGNOSIS — J9621 Acute and chronic respiratory failure with hypoxia: Secondary | ICD-10-CM | POA: Diagnosis present

## 2017-04-20 DIAGNOSIS — I130 Hypertensive heart and chronic kidney disease with heart failure and stage 1 through stage 4 chronic kidney disease, or unspecified chronic kidney disease: Secondary | ICD-10-CM | POA: Diagnosis present

## 2017-04-20 DIAGNOSIS — I482 Chronic atrial fibrillation: Secondary | ICD-10-CM | POA: Diagnosis not present

## 2017-04-20 DIAGNOSIS — I5032 Chronic diastolic (congestive) heart failure: Secondary | ICD-10-CM | POA: Diagnosis present

## 2017-04-20 DIAGNOSIS — K5190 Ulcerative colitis, unspecified, without complications: Secondary | ICD-10-CM | POA: Diagnosis present

## 2017-04-20 DIAGNOSIS — Z87891 Personal history of nicotine dependence: Secondary | ICD-10-CM

## 2017-04-20 DIAGNOSIS — R4701 Aphasia: Secondary | ICD-10-CM | POA: Diagnosis present

## 2017-04-20 DIAGNOSIS — R1084 Generalized abdominal pain: Secondary | ICD-10-CM | POA: Diagnosis not present

## 2017-04-20 DIAGNOSIS — Z823 Family history of stroke: Secondary | ICD-10-CM

## 2017-04-20 DIAGNOSIS — Z7901 Long term (current) use of anticoagulants: Secondary | ICD-10-CM | POA: Diagnosis not present

## 2017-04-20 DIAGNOSIS — J690 Pneumonitis due to inhalation of food and vomit: Secondary | ICD-10-CM | POA: Diagnosis present

## 2017-04-20 DIAGNOSIS — A419 Sepsis, unspecified organism: Principal | ICD-10-CM | POA: Diagnosis present

## 2017-04-20 DIAGNOSIS — R109 Unspecified abdominal pain: Secondary | ICD-10-CM

## 2017-04-20 DIAGNOSIS — I5033 Acute on chronic diastolic (congestive) heart failure: Secondary | ICD-10-CM | POA: Diagnosis present

## 2017-04-20 DIAGNOSIS — I248 Other forms of acute ischemic heart disease: Secondary | ICD-10-CM | POA: Diagnosis present

## 2017-04-20 DIAGNOSIS — I10 Essential (primary) hypertension: Secondary | ICD-10-CM | POA: Diagnosis present

## 2017-04-20 DIAGNOSIS — Z888 Allergy status to other drugs, medicaments and biological substances status: Secondary | ICD-10-CM

## 2017-04-20 DIAGNOSIS — Z66 Do not resuscitate: Secondary | ICD-10-CM | POA: Diagnosis present

## 2017-04-20 DIAGNOSIS — E785 Hyperlipidemia, unspecified: Secondary | ICD-10-CM | POA: Diagnosis present

## 2017-04-20 DIAGNOSIS — I639 Cerebral infarction, unspecified: Secondary | ICD-10-CM | POA: Diagnosis present

## 2017-04-20 DIAGNOSIS — F0390 Unspecified dementia without behavioral disturbance: Secondary | ICD-10-CM | POA: Diagnosis present

## 2017-04-20 DIAGNOSIS — J181 Lobar pneumonia, unspecified organism: Secondary | ICD-10-CM | POA: Diagnosis not present

## 2017-04-20 DIAGNOSIS — R1110 Vomiting, unspecified: Secondary | ICD-10-CM | POA: Diagnosis not present

## 2017-04-20 LAB — I-STAT ARTERIAL BLOOD GAS, ED
ACID-BASE DEFICIT: 3 mmol/L — AB (ref 0.0–2.0)
Bicarbonate: 20.6 mmol/L (ref 20.0–28.0)
O2 SAT: 92 %
PH ART: 7.409 (ref 7.350–7.450)
TCO2: 22 mmol/L (ref 0–100)
pCO2 arterial: 32.8 mmHg (ref 32.0–48.0)
pO2, Arterial: 64 mmHg — ABNORMAL LOW (ref 83.0–108.0)

## 2017-04-20 LAB — BASIC METABOLIC PANEL
Anion gap: 18 — ABNORMAL HIGH (ref 5–15)
BUN: 25 mg/dL — AB (ref 6–20)
CHLORIDE: 100 mmol/L — AB (ref 101–111)
CO2: 18 mmol/L — ABNORMAL LOW (ref 22–32)
Calcium: 9.1 mg/dL (ref 8.9–10.3)
Creatinine, Ser: 1.32 mg/dL — ABNORMAL HIGH (ref 0.61–1.24)
GFR calc Af Amer: 54 mL/min — ABNORMAL LOW (ref >60)
GFR calc non Af Amer: 46 mL/min — ABNORMAL LOW (ref >60)
Glucose, Bld: 218 mg/dL — ABNORMAL HIGH (ref 65–99)
POTASSIUM: 4 mmol/L (ref 3.5–5.1)
Sodium: 136 mmol/L (ref 135–145)

## 2017-04-20 LAB — PROTIME-INR
INR: 6.64 — AB
Prothrombin Time: 60 seconds — ABNORMAL HIGH (ref 11.4–15.2)

## 2017-04-20 LAB — CBC WITH DIFFERENTIAL/PLATELET
BASOS ABS: 0.1 10*3/uL (ref 0.0–0.1)
BASOS PCT: 0 %
EOS ABS: 0.1 10*3/uL (ref 0.0–0.7)
EOS PCT: 0 %
HCT: 29.8 % — ABNORMAL LOW (ref 39.0–52.0)
Hemoglobin: 9.8 g/dL — ABNORMAL LOW (ref 13.0–17.0)
LYMPHS PCT: 4 %
Lymphs Abs: 0.9 10*3/uL (ref 0.7–4.0)
MCH: 33.7 pg (ref 26.0–34.0)
MCHC: 32.9 g/dL (ref 30.0–36.0)
MCV: 102.4 fL — AB (ref 78.0–100.0)
Monocytes Absolute: 2.1 10*3/uL — ABNORMAL HIGH (ref 0.1–1.0)
Monocytes Relative: 9 %
Neutro Abs: 19.5 10*3/uL — ABNORMAL HIGH (ref 1.7–7.7)
Neutrophils Relative %: 87 %
PLATELETS: 328 10*3/uL (ref 150–400)
RBC: 2.91 MIL/uL — AB (ref 4.22–5.81)
RDW: 14.5 % (ref 11.5–15.5)
WBC: 22.6 10*3/uL — AB (ref 4.0–10.5)

## 2017-04-20 LAB — I-STAT CG4 LACTIC ACID, ED: LACTIC ACID, VENOUS: 8.38 mmol/L — AB (ref 0.5–1.9)

## 2017-04-20 LAB — TROPONIN I: TROPONIN I: 0.05 ng/mL — AB (ref <0.03)

## 2017-04-20 LAB — BRAIN NATRIURETIC PEPTIDE: B NATRIURETIC PEPTIDE 5: 1504.9 pg/mL — AB (ref 0.0–100.0)

## 2017-04-20 MED ORDER — ACETAMINOPHEN 325 MG PO TABS
650.0000 mg | ORAL_TABLET | Freq: Once | ORAL | Status: DC
  Filled 2017-04-20: qty 2

## 2017-04-20 MED ORDER — SODIUM CHLORIDE 0.9 % IV BOLUS (SEPSIS)
1000.0000 mL | Freq: Once | INTRAVENOUS | Status: AC
  Administered 2017-04-20: 1000 mL via INTRAVENOUS

## 2017-04-20 MED ORDER — SODIUM CHLORIDE 0.9 % IV BOLUS (SEPSIS): 250.0000 mL | Freq: Once | INTRAVENOUS | Status: DC

## 2017-04-20 MED ORDER — ACETAMINOPHEN 160 MG/5ML PO SOLN
650.0000 mg | Freq: Once | ORAL | Status: AC
  Administered 2017-04-20: 650 mg via ORAL
  Filled 2017-04-20: qty 20.3

## 2017-04-20 MED ORDER — PIPERACILLIN-TAZOBACTAM 3.375 G IVPB
3.3750 g | Freq: Once | INTRAVENOUS | Status: AC
  Administered 2017-04-20: 3.375 g via INTRAVENOUS
  Filled 2017-04-20: qty 50

## 2017-04-20 MED ORDER — SODIUM CHLORIDE 0.9 % IV BOLUS (SEPSIS)
500.0000 mL | Freq: Once | INTRAVENOUS | Status: AC
  Administered 2017-04-20: 500 mL via INTRAVENOUS

## 2017-04-20 MED ORDER — VANCOMYCIN HCL IN DEXTROSE 1-5 GM/200ML-% IV SOLN
1000.0000 mg | Freq: Once | INTRAVENOUS | Status: AC
  Administered 2017-04-20: 1000 mg via INTRAVENOUS
  Filled 2017-04-20: qty 200

## 2017-04-20 MED ORDER — ONDANSETRON HCL 4 MG/2ML IJ SOLN
4.0000 mg | Freq: Once | INTRAMUSCULAR | Status: AC
  Administered 2017-04-20: 4 mg via INTRAVENOUS
  Filled 2017-04-20: qty 2

## 2017-04-20 NOTE — ED Notes (Signed)
[facility] Blood sent to lab. NRB placed.

## 2017-04-20 NOTE — ED Notes (Signed)
INR is 6.64 and troponin is 0.05. Dr. Judd Lienelo and primary RN aware.

## 2017-04-20 NOTE — ED Provider Notes (Signed)
[facility]ENCY [facility]PT [facility] Provider Note   CSN: [id number] Arrival date & time: 04/20/17  2118  By signing my name below, I, Rosana Fret, attest that this documentation has been prepared under the direction and in the presence of Geoffery Lyons, MD. Electronically Signed: Rosana Fret, ED Scribe. 04/20/17. 10:04 PM.  History   Chief Complaint Chief Complaint  Patient presents with  . Emesis   The history is provided by the patient and a relative. No language interpreter was used.  Emesis   This is a new problem. The current episode started less than 1 hour ago. The problem occurs 2 to 4 times per day. The emesis has an appearance of stomach contents. There has been no fever. Pertinent negatives include no fever.   HPI Comments: Caleb Delgado is a 83 y.o. male  With a PMHx of COPD, CAD, Dementia and HTN, who presents to the Emergency Department complaining of sudden onset, episodes of emesis onset 30 minutes ago. Pt has also had shaking that family indicates is not normal. Pt lives at home with around the clock care. Pt does not use O2 at home. No fever, hematemesis or any other complaints at this time.  Past Medical History:  Diagnosis Date  . Atrial fibrillation (HCC)   . BPH (benign prostatic hyperplasia)   . CAD (coronary artery disease)   . Expressive aphasia   . HTN (hypertension)   . Other and unspecified hyperlipidemia   . S/P AVR (aortic valve replacement)   . Stroke [facility])    TIA  . Ulcerative colitis (HCC)   . Unspecified sinusitis (chronic)     Patient Active Problem List   Diagnosis Date Noted  . CHF (congestive heart failure) (HCC) 08/30/2015  . Acute on chronic diastolic heart failure (HCC) 08/29/2015  . Bradycardia 08/29/2015  . CKD (chronic kidney disease), stage III 08/29/2015  . Cerebrovascular disease 01/19/2014  . Atrial fibrillation (HCC) 06/02/2013  . CAD (coronary artery disease) 06/02/2013  . Expressive aphasia 12/06/2012  . HTN  (hypertension) 12/06/2012  . Other and unspecified hyperlipidemia 12/06/2012  . BPH (benign prostatic hyperplasia) 12/06/2012  . history of TIA 12/06/2012  . S/P AVR (aortic valve replacement) 12/06/2012  . Unspecified sinusitis (chronic) 12/06/2012    Past Surgical History:  Procedure Laterality Date  . AORTIC VALVE REPAIR    . CARDIAC SURGERY    . LUNG SURGERY     Thoracotomy for polyps  . TEE WITHOUT CARDIOVERSION N/A 12/08/2012   Procedure: TRANSESOPHAGEAL ECHOCARDIOGRAM (TEE);  Surgeon: Lewayne Bunting, MD;  Location[facility] ENDOSCOPY;  Service: Cardiovascular;  Laterality: N/A;       Home Medications    Prior to Admission medications   Medication Sig Start Date End Date Taking? Authorizing Provider  albuterol (PROVENTIL HFA;VENTOLIN HFA) 108 (90 BASE) MCG/ACT inhaler Inhale 1-2 puffs into the lungs every 6 (six) hours as needed for wheezing or shortness of breath.    [provider]  amLODipine (NORVASC) 10 MG tablet Take 1 tablet (10 mg total) by mouth daily. 06/05/16   Lewayne Bunting, MD  Cholecalciferol 1000 UNITS TBDP Take 1,000 Units by mouth at bedtime.    [provider]  donepezil (ARICEPT) 10 MG tablet Take 10 mg by mouth at bedtime.    [provider]  escitalopram (LEXAPRO) 10 MG tablet Take 10 mg by mouth daily.  10/19/16   [provider]  folic acid (FOLVITE) 0.5 MG tablet Take 0.5 mg by mouth daily.  [provider]  furosemide (LASIX) 20 MG tablet Take 1 tablet (20 mg total) by mouth daily. 06/13/16   Lewayne Buntingrenshaw, Brian S, MD  gabapentin (NEURONTIN) 300 MG capsule Take 300 mg by mouth at bedtime.  05/20/13   [provider]  Magnesium 250 MG TABS Take 250 mg by mouth daily.    [provider]  Multiple Vitamin (MULTIVITAMIN) tablet Take 1 tablet by mouth daily.    [provider]  potassium chloride (K-DUR) 10 MEQ tablet Take 1 tablet (10 mEq total) by mouth once. 06/13/16 10/30/16  Lewayne Buntingrenshaw, Brian  S, MD  pravastatin (PRAVACHOL) 40 MG tablet TAKE 1 TABLET (40 MG TOTAL) BY MOUTH EVERY EVENING. 03/17/17   Lewayne Buntingrenshaw, Brian S, MD  sulfaSALAzine (AZULFIDINE) 500 MG tablet Take 500 mg by mouth 2 (two) times daily.  01/02/14   [provider]  tamsulosin (FLOMAX) 0.4 MG CAPS Take 0.4 mg by mouth daily after supper.    [provider]  traZODone (DESYREL) 50 MG tablet Take 50 mg by mouth at bedtime.    [provider]  warfarin (COUMADIN) 5 MG tablet TAKE AS DIRECTED Patient taking differently: Take 2.5-5 mg by mouth daily at 6 PM. Takes 5mg  on Tues, Thurs and Sat Takes 2.5mg  all other days 06/16/14   Lewayne Buntingrenshaw, Brian S, MD    Family History Family History  Problem Relation Age of Onset  . Stroke Mother   . Diabetes Mother   . Stroke Father     Social History Social History  Substance Use Topics  . Smoking status: Former Smoker    Quit date: 10/07/1970  . Smokeless tobacco: Never Used  . Alcohol use Yes     Comment: Rarely     Allergies   Nebivolol and Tramadol   Review of Systems Review of Systems  Constitutional: Negative for fever.  Gastrointestinal: Positive for vomiting.  Neurological: Positive for tremors.  All other systems reviewed and are negative.    Physical Exam Updated Vital Signs BP 116/67 (BP Location: Right Arm)   Pulse (!) 117   Temp 99.6 F (37.6 C) (Tympanic)   Resp (!) 27   SpO2 96%   Physical Exam  Constitutional: He is oriented to person, place, and time. He appears well-developed and well-nourished.  Pt appears acutely ill and somewhat disoriented.   HENT:  Head: Normocephalic and atraumatic.  Eyes: EOM are normal.  Neck: Normal range of motion.  Cardiovascular: Normal rate, regular rhythm and normal heart sounds.   Pulmonary/Chest: He is in respiratory distress.  Breath sounds are rhonchus bilaterally. He is in moderate respiratory distress.   Abdominal: Soft. He exhibits no distension. There is no tenderness.    Musculoskeletal: Normal range of motion. He exhibits edema.  Trace edema in both lower extremities.   Neurological: He is alert and oriented to person, place, and time.  Skin: Skin is warm and dry.  Psychiatric: He has a normal mood and affect. Judgment normal.  Nursing note and vitals reviewed.    ED Treatments / Results  DIAGNOSTIC STUDIES: Oxygen Saturation is 84% on [place], low by my interpretation.   COORDINATION OF CARE: 9:48 PM-Discussed next steps with pt's family including lab work. Pt's family verbalized understanding and is agreeable with the plan.   Labs (all labs ordered are listed, but only abnormal results are displayed) Labs Reviewed  BASIC METABOLIC PANEL - Abnormal; Notable for the following:       Result Value   Chloride 100 (*)  CO2 18 (*)    Glucose, Bld 218 (*)    BUN 25 (*)    Creatinine, Ser 1.32 (*)    GFR calc non Af Amer 46 (*)    GFR calc Af Amer 54 (*)    Anion gap 18 (*)    All other components within normal limits  CBC WITH DIFFERENTIAL/PLATELET - Abnormal; Notable for the following:    WBC 22.6 (*)    RBC 2.91 (*)    Hemoglobin 9.8 (*)    HCT 29.8 (*)    MCV 102.4 (*)    Neutro Abs 19.5 (*)    Monocytes Absolute 2.1 (*)    All other components within normal limits  I-STAT CG4 LACTIC ACID, ED - Abnormal; Notable for the following:    Lactic Acid, Venous 8.38 (*)    All other components within normal limits  CULTURE, BLOOD (ROUTINE X 2)  CULTURE, BLOOD (ROUTINE X 2)  TROPONIN I  PROTIME-INR  BRAIN NATRIURETIC PEPTIDE  URINALYSIS, ROUTINE W REFLEX MICROSCOPIC    EKG  EKG Interpretation None       Radiology Dg Chest Portable 1 View  Result Date: 04/20/2017 CLINICAL DATA:  Acute onset of respiratory difficulty.  Trampoline. EXAM: PORTABLE CHEST 1 VIEW COMPARISON:  The heart size is normal. Atherosclerotic calcifications are present at the aortic arch. FINDINGS: Heart is enlarged. Aortic atherosclerosis is present. Right lower  lobe airspace disease is superimposed on chronic interstitial coarsening. Mild edema is present as well. Chronic right pleural thickening is again noted. IMPRESSION: 1. Right lower lobe airspace disease concerning for lobar pneumonia. 2. Chronic right pleural thickening. 3. Cardiac enlargement and mild edema. Electronically Signed   By: Marin Roberts M.D.   On: 04/20/2017 21:52    Procedures Procedures (including critical care time)  Medications Ordered in ED Medications  sodium chloride 0.9 % bolus 500 mL (not administered)  vancomycin (VANCOCIN) IVPB 1000 mg/200 mL premix (not administered)  piperacillin-tazobactam (ZOSYN) IVPB 3.375 g (not administered)  ondansetron (ZOFRAN) injection 4 mg (4 mg Intravenous Given 04/20/17 2153)     Initial Impression / Assessment and Plan / ED Course  I have reviewed the triage vital signs and the nursing notes.  Pertinent labs & imaging results that were available during my care of the patient were reviewed by me and considered in my medical decision making (see chart for details).  Patient with history of COPD brought by family for evaluation of shakiness, confusion, and vomiting that started this evening. When he was triaged he was found to be hypoxic, dusky, and unwell appearing. I was called to the room immediately and assessed the patient.  He was hypoxic and placed on a venturi mask with some improvement in his oxygenation. ABG reveals hypoxia but no significant CO2 retention. Laboratory studies returned with an elevated lactate, significant leukocytosis, with borderline fever of 99.6. Chest x-ray shows what appears to be pneumonia.  A code sepsis was called and appropriate fluids, antibiotics, and cultures were performed.  I've discussed the care with Dr. Julian Reil from the hospitalist service and the patient will be admitted to the stepdown unit at [facility] for further treatment.  In discussion with the multiple family members  at bedside, the patient is a DO NOT RESUSCITATE/DO NOT INTUBATE if it comes to this. They do request appropriate care up to this point.  CRITICAL CARE Performed by: Geoffery Lyons Total critical care time: 45 minutes Critical care time was exclusive of separately billable procedures  and treating other patients. Critical care was necessary to treat or prevent imminent or life-threatening deterioration. Critical care was time spent personally by me on the following activities: development of treatment plan with patient and/or surrogate as well as nursing, discussions with consultants, evaluation of patient's response to treatment, examination of patient, obtaining history from patient or surrogate, ordering and performing treatments and interventions, ordering and review of laboratory studies, ordering and review of radiographic studies, pulse oximetry and re-evaluation of patient's condition.   Final Clinical Impressions(s) / ED Diagnoses   Final diagnoses:  None    New Prescriptions New Prescriptions   No medications on file   I personally performed the services described in this documentation, which was scribed in my presence. The recorded information has been reviewed and is accurate.        Geoffery Lyons, MD 04/20/17 2250

## 2017-04-20 NOTE — ED Notes (Signed)
Alert, anxious, very tremulous/ shaky, passively interactive, responds to commands appropriately, resps labored, dyspneic, tachypneic, orthopneic, rales, low SPO2, non-verbal at this time, severe dementia per family, skin W&D. [facility]

## 2017-04-20 NOTE — ED Notes (Signed)
[facility] tolerating [facility], calmer, breathing easier.

## 2017-04-20 NOTE — Plan of Care (Signed)
83 yo M with emesis, CXR shows RLL PNA, WBC 22k, Rigors at home, Tm 99.9.  Lactate is 8.3!  INR 6.6  Wife died 1-2 weeks ago.  Patient has fairly severe dementia at baseline, non-verbal.  Family wants patient to be DNR/DNI; however, give medical treatment a chance they say.  1) advised Dr. Bradly Chriseloto give full sepsis NS IVF bolus of 30ml / kg 2) did warn however that his heart may or may not be able to take this and I could see this causing worsening respiratory status, fluid overload potentially.  However with lactate of 8.3, he is clearly septic and if we wanted any chance of medical resolution this was needed. 3) Patient going to SDU 4) EDP already discussing high chance of inpatient mortality in this critically ill, elderly gentleman with the family.

## 2017-04-20 NOTE — ED Triage Notes (Signed)
PT presents with c/o vomiting and shaking that started today.

## 2017-04-20 NOTE — ED Notes (Signed)
Pending consult. No changes. O2 face mask in place, 15L. Breathing easier, calmer, less shaky, follows commands, oriented to person and family (dementia). VSS. IVF bolus complete. zosyn and vanc infusing, acetaminophen given. Handled and tolerated PO fluids/meds well. Remains alert, interactive, NAD.

## 2017-04-21 ENCOUNTER — Inpatient Hospital Stay (HOSPITAL_COMMUNITY): Payer: Medicare Other

## 2017-04-21 ENCOUNTER — Encounter (HOSPITAL_COMMUNITY): Payer: Self-pay | Admitting: Internal Medicine

## 2017-04-21 DIAGNOSIS — R1084 Generalized abdominal pain: Secondary | ICD-10-CM

## 2017-04-21 DIAGNOSIS — J9601 Acute respiratory failure with hypoxia: Secondary | ICD-10-CM

## 2017-04-21 DIAGNOSIS — J690 Pneumonitis due to inhalation of food and vomit: Secondary | ICD-10-CM | POA: Diagnosis present

## 2017-04-21 DIAGNOSIS — I639 Cerebral infarction, unspecified: Secondary | ICD-10-CM | POA: Diagnosis present

## 2017-04-21 DIAGNOSIS — K5190 Ulcerative colitis, unspecified, without complications: Secondary | ICD-10-CM | POA: Diagnosis present

## 2017-04-21 DIAGNOSIS — R109 Unspecified abdominal pain: Secondary | ICD-10-CM | POA: Diagnosis present

## 2017-04-21 DIAGNOSIS — A419 Sepsis, unspecified organism: Principal | ICD-10-CM

## 2017-04-21 DIAGNOSIS — J189 Pneumonia, unspecified organism: Secondary | ICD-10-CM | POA: Diagnosis present

## 2017-04-21 DIAGNOSIS — J9621 Acute and chronic respiratory failure with hypoxia: Secondary | ICD-10-CM | POA: Diagnosis present

## 2017-04-21 LAB — TROPONIN I
TROPONIN I: 0.29 ng/mL — AB (ref <0.03)
Troponin I: 0.76 ng/mL (ref <0.03)
Troponin I: 0.68 ng/mL (ref <0.03)

## 2017-04-21 LAB — RESPIRATORY PANEL BY PCR
ADENOVIRUS-RVPPCR: NOT DETECTED
BORDETELLA PERTUSSIS-RVPCR: NOT DETECTED
CHLAMYDOPHILA PNEUMONIAE-RVPPCR: NOT DETECTED
CORONAVIRUS NL63-RVPPCR: NOT DETECTED
Coronavirus 229E: NOT DETECTED
Coronavirus HKU1: NOT DETECTED
Coronavirus OC43: NOT DETECTED
INFLUENZA A-RVPPCR: NOT DETECTED
Influenza B: NOT DETECTED
MYCOPLASMA PNEUMONIAE-RVPPCR: NOT DETECTED
Metapneumovirus: NOT DETECTED
PARAINFLUENZA VIRUS 3-RVPPCR: NOT DETECTED
PARAINFLUENZA VIRUS 4-RVPPCR: NOT DETECTED
Parainfluenza Virus 1: NOT DETECTED
Parainfluenza Virus 2: NOT DETECTED
RHINOVIRUS / ENTEROVIRUS - RVPPCR: NOT DETECTED
Respiratory Syncytial Virus: NOT DETECTED

## 2017-04-21 LAB — LACTIC ACID, PLASMA
LACTIC ACID, VENOUS: 2.6 mmol/L — AB (ref 0.5–1.9)
LACTIC ACID, VENOUS: 2 mmol/L — AB (ref 0.5–1.9)
Lactic Acid, Venous: 2.1 mmol/L (ref 0.5–1.9)

## 2017-04-21 LAB — CBC WITH DIFFERENTIAL/PLATELET
BASOS ABS: 0 10*3/uL (ref 0.0–0.1)
BASOS PCT: 0 %
EOS ABS: 0 10*3/uL (ref 0.0–0.7)
Eosinophils Relative: 0 %
HCT: 25.1 % — ABNORMAL LOW (ref 39.0–52.0)
Hemoglobin: 8.1 g/dL — ABNORMAL LOW (ref 13.0–17.0)
LYMPHS PCT: 6 %
Lymphs Abs: 1.1 10*3/uL (ref 0.7–4.0)
MCH: 32.4 pg (ref 26.0–34.0)
MCHC: 32.3 g/dL (ref 30.0–36.0)
MCV: 100.4 fL — ABNORMAL HIGH (ref 78.0–100.0)
MONO ABS: 0.9 10*3/uL (ref 0.1–1.0)
Monocytes Relative: 5 %
NEUTROS PCT: 89 %
Neutro Abs: 16.3 10*3/uL — ABNORMAL HIGH (ref 1.7–7.7)
PLATELETS: 235 10*3/uL (ref 150–400)
RBC: 2.5 MIL/uL — AB (ref 4.22–5.81)
RDW: 14.8 % (ref 11.5–15.5)
WBC: 18.3 10*3/uL — AB (ref 4.0–10.5)

## 2017-04-21 LAB — COMPREHENSIVE METABOLIC PANEL
ALBUMIN: 3.1 g/dL — AB (ref 3.5–5.0)
ALT: 23 U/L (ref 17–63)
AST: 38 U/L (ref 15–41)
Alkaline Phosphatase: 65 U/L (ref 38–126)
Anion gap: 10 (ref 5–15)
BUN: 21 mg/dL — AB (ref 6–20)
CHLORIDE: 105 mmol/L (ref 101–111)
CO2: 23 mmol/L (ref 22–32)
CREATININE: 1.38 mg/dL — AB (ref 0.61–1.24)
Calcium: 8.2 mg/dL — ABNORMAL LOW (ref 8.9–10.3)
GFR calc Af Amer: 51 mL/min — ABNORMAL LOW (ref >60)
GFR, EST NON AFRICAN AMERICAN: 44 mL/min — AB (ref >60)
Glucose, Bld: 158 mg/dL — ABNORMAL HIGH (ref 65–99)
POTASSIUM: 3.9 mmol/L (ref 3.5–5.1)
SODIUM: 138 mmol/L (ref 135–145)
Total Bilirubin: 1.2 mg/dL (ref 0.3–1.2)
Total Protein: 6.5 g/dL (ref 6.5–8.1)

## 2017-04-21 LAB — URINALYSIS, ROUTINE W REFLEX MICROSCOPIC
Bilirubin Urine: NEGATIVE
GLUCOSE, UA: NEGATIVE mg/dL
Ketones, ur: NEGATIVE mg/dL
Leukocytes, UA: NEGATIVE
NITRITE: NEGATIVE
Protein, ur: 100 mg/dL — AB
SPECIFIC GRAVITY, URINE: 1.029 (ref 1.005–1.030)
pH: 5 (ref 5.0–8.0)

## 2017-04-21 LAB — PROTIME-INR
INR: 7.71
PROTHROMBIN TIME: 67.6 s — AB (ref 11.4–15.2)

## 2017-04-21 LAB — STREP PNEUMONIAE URINARY ANTIGEN: Strep Pneumo Urinary Antigen: NEGATIVE

## 2017-04-21 LAB — PROCALCITONIN: Procalcitonin: 0.52 ng/mL

## 2017-04-21 LAB — LIPASE, BLOOD: LIPASE: 72 U/L — AB (ref 11–51)

## 2017-04-21 LAB — MRSA PCR SCREENING: MRSA by PCR: NEGATIVE

## 2017-04-21 MED ORDER — HYDRALAZINE HCL 20 MG/ML IJ SOLN: 5.0000 mg | INTRAMUSCULAR | Status: DC | PRN

## 2017-04-21 MED ORDER — SULFASALAZINE 500 MG PO TABS
500.0000 mg | ORAL_TABLET | Freq: Two times a day (BID) | ORAL | Status: DC
  Administered 2017-04-21 – 2017-04-26 (×11): 500 mg via ORAL
  Filled 2017-04-21 (×12): qty 1

## 2017-04-21 MED ORDER — ESCITALOPRAM OXALATE 10 MG PO TABS
10.0000 mg | ORAL_TABLET | Freq: Every day | ORAL | Status: DC
  Administered 2017-04-21 – 2017-04-26 (×6): 10 mg via ORAL
  Filled 2017-04-21 (×6): qty 1

## 2017-04-21 MED ORDER — DONEPEZIL HCL 10 MG PO TABS
10.0000 mg | ORAL_TABLET | Freq: Every day | ORAL | Status: DC
  Administered 2017-04-21 – 2017-04-25 (×5): 10 mg via ORAL
  Filled 2017-04-21 (×5): qty 1

## 2017-04-21 MED ORDER — ACETAMINOPHEN 325 MG PO TABS: 650.0000 mg | ORAL_TABLET | Freq: Four times a day (QID) | ORAL | Status: DC | PRN

## 2017-04-21 MED ORDER — PIPERACILLIN-TAZOBACTAM 3.375 G IVPB
3.3750 g | Freq: Three times a day (TID) | INTRAVENOUS | Status: DC
  Administered 2017-04-21 – 2017-04-26 (×15): 3.375 g via INTRAVENOUS
  Filled 2017-04-21 (×16): qty 50

## 2017-04-21 MED ORDER — ADULT MULTIVITAMIN W/MINERALS CH
1.0000 | ORAL_TABLET | Freq: Every day | ORAL | Status: DC
  Administered 2017-04-21 – 2017-04-26 (×6): 1 via ORAL
  Filled 2017-04-21 (×6): qty 1

## 2017-04-21 MED ORDER — CO Q 10 10 MG PO CAPS: 1.0000 | ORAL_CAPSULE | Freq: Every day | ORAL | Status: DC

## 2017-04-21 MED ORDER — ZOLPIDEM TARTRATE 5 MG PO TABS: 5.0000 mg | ORAL_TABLET | Freq: Every evening | ORAL | Status: DC | PRN

## 2017-04-21 MED ORDER — ALBUTEROL SULFATE (2.5 MG/3ML) 0.083% IN NEBU: 2.5000 mg | INHALATION_SOLUTION | RESPIRATORY_TRACT | Status: DC | PRN

## 2017-04-21 MED ORDER — FOLIC ACID 1 MG PO TABS
0.5000 mg | ORAL_TABLET | Freq: Every day | ORAL | Status: DC
  Administered 2017-04-21 – 2017-04-26 (×6): 0.5 mg via ORAL
  Filled 2017-04-21 (×8): qty 1

## 2017-04-21 MED ORDER — TAMSULOSIN HCL 0.4 MG PO CAPS
0.4000 mg | ORAL_CAPSULE | Freq: Every day | ORAL | Status: DC
  Administered 2017-04-21 – 2017-04-25 (×5): 0.4 mg via ORAL
  Filled 2017-04-21 (×6): qty 1

## 2017-04-21 MED ORDER — VITAMIN D 1000 UNITS PO TABS
1000.0000 [IU] | ORAL_TABLET | Freq: Every day | ORAL | Status: DC
  Administered 2017-04-21 – 2017-04-25 (×5): 1000 [IU] via ORAL
  Filled 2017-04-21 (×5): qty 1

## 2017-04-21 MED ORDER — HYDROXYZINE HCL 50 MG/ML IM SOLN
25.0000 mg | Freq: Four times a day (QID) | INTRAMUSCULAR | Status: DC | PRN
  Filled 2017-04-21: qty 0.5

## 2017-04-21 MED ORDER — SODIUM CHLORIDE 0.9 % IV SOLN
INTRAVENOUS | Status: DC
Start: 2017-04-21 — End: 2017-04-21
  Administered 2017-04-21: 08:00:00 via INTRAVENOUS

## 2017-04-21 MED ORDER — DEXTROSE 5 % IV SOLN
250.0000 mg | INTRAVENOUS | Status: DC
  Administered 2017-04-21 – 2017-04-26 (×6): 250 mg via INTRAVENOUS
  Filled 2017-04-21 (×7): qty 250

## 2017-04-21 MED ORDER — VITAMIN K1 10 MG/ML IJ SOLN
1.0000 mg | Freq: Once | INTRAMUSCULAR | Status: AC
  Administered 2017-04-21: 1 mg via INTRAVENOUS
  Filled 2017-04-21: qty 0.1

## 2017-04-21 MED ORDER — DM-GUAIFENESIN ER 30-600 MG PO TB12: 1.0000 | ORAL_TABLET | Freq: Two times a day (BID) | ORAL | Status: DC | PRN

## 2017-04-21 MED ORDER — GABAPENTIN 300 MG PO CAPS
300.0000 mg | ORAL_CAPSULE | Freq: Every day | ORAL | Status: DC
  Administered 2017-04-21 – 2017-04-25 (×5): 300 mg via ORAL
  Filled 2017-04-21 (×5): qty 1

## 2017-04-21 MED ORDER — PRAVASTATIN SODIUM 40 MG PO TABS
40.0000 mg | ORAL_TABLET | Freq: Every evening | ORAL | Status: DC
  Administered 2017-04-21 – 2017-04-25 (×5): 40 mg via ORAL
  Filled 2017-04-21 (×5): qty 1

## 2017-04-21 MED ORDER — MAGNESIUM OXIDE 400 (241.3 MG) MG PO TABS
200.0000 mg | ORAL_TABLET | Freq: Every day | ORAL | Status: DC
  Administered 2017-04-21 – 2017-04-26 (×6): 200 mg via ORAL
  Filled 2017-04-21 (×6): qty 1

## 2017-04-21 NOTE — Care Management Note (Signed)
Case Management Note  Patient Details  Name: Elder NegusFred Carlton Falotico MRN: [id number] Date of Birth: 09/16/1926  Subjective/Objective:  From home alone, presents with acute on chronic resp failure in setting of probable asp pna, chf, afib, sepsis, ulcerative colitis,  on venturi Mask, his wife just expired a couple months ago, INR is 7.71, on vit k, for BMS tomorrow.                   Action/Plan: NCM will follow for dc needs.   Expected Discharge Date:  04/24/17               Expected Discharge Plan:     In-House Referral:     Discharge planning Services  CM Consult  Post Acute Care Choice:    Choice offered to:     DME Arranged:    DME Agency:     HH Arranged:    HH Agency:     Status of Service:  In process, will continue to follow  If discussed at Long Length of Stay Meetings, dates discussed:    Additional Comments:  Leone Havenaylor, Fumie Fiallo [place], RN 04/21/2017, 5:38 PM

## 2017-04-21 NOTE — Telephone Encounter (Signed)
New message      *STAT* If patient is at the pharmacy, call can be transferred to refill team.   1. Which medications need to be refilled? (please list name of each medication and dose if known) pravastatin (PRAVACHOL) tablet 40 mg  2. Which pharmacy/location (including street and city if local pharmacy) [facility] [place]  3. Do they need a 30 day or 90 day supply? 90

## 2017-04-21 NOTE — Progress Notes (Signed)
Discussed with Dr. Fayrene FearingXiu about critical troponin 0.29 and lactic acid 2.6.  Previous 12 lead EKG showed a heart block which he was made aware of from admission reading by CCMD. Patient not complaining of chest pain at this time will continue to monitior and perform another bladder scan in the morning.

## 2017-04-21 NOTE — ED Notes (Signed)
No changes, [facility] here, care transferred, family present.

## 2017-04-21 NOTE — Progress Notes (Signed)
PROGRESS NOTE  Caleb Delgado KGM:[id number] DOB: 1926/08/18 DOA: 04/20/2017 PCP: Loyal Jacobson, MD   LOS: 1 day   Brief Narrative / Interim history: 83 year old male with hypertension, hyperlipidemia, depression, UC, by of valve AVR, coronary artery disease with history of CABG, diastolic CHF, dementia, stroke, nonverbal status, A. fib on Coumadin who was admitted on 7/16 with hypoxic respiratory failure in the setting of probable aspiration pneumonia.  He has had no nausea vomiting or abdominal pain prior to coming to the hospital with several emesis episodes.  Assessment & Plan: Principal Problem:   Acute on chronic respiratory failure with hypoxia (HCC) Active Problems:   Expressive aphasia   HTN (hypertension)   S/P AVR (aortic valve replacement)   Atrial fibrillation (HCC)   CAD (coronary artery disease)   Chronic diastolic CHF (congestive heart failure) (HCC)   CKD (chronic kidney disease), stage III   Sepsis (HCC)   CAP (community acquired pneumonia)   Ulcerative colitis (HCC)   Stroke (HCC)   Abdominal pain   Aspiration pneumonia (HCC)  Acute on chronic hypoxic respiratory failure due to aspiration pneumonia, right lower lobe pneumonia, sepsis -Placed on vancomycin, Zosyn and azithromycin. -Continue Zosyn and azithromycin -Respiratory virus panel sent on admission -Oxygen support as needed  Acute on chronic diastolic CHF -2D echo in 2016 with an EF of 55-60%.  Appears with mild fluid overload, hold off Lasix in the setting of sepsis. -Lactic acid is improved, decreased rate of fluids  Atrial fibrillation -CHA2DS2-VASc Score is 5, hold Coumadin for now as INR is supratherapeutic, increased from 6.6 on admission to 7.7 this morning  Supratherapeutic INR -Vitamin K 1 mg 1  Hypertension -Hold Norvasc and Lasix  Coronary artery disease -No reported chest pain, troponin elevation likely due to demand ischemia in the setting of sepsis and acute on chronic  CHF -Continue to cycle until peaks  Chronic kidney disease stage III -Creatinine is at baseline, continue to follow  Ulcerative colitis -Continue sulfasalazine  Prior history of CVA -Continue statin, he is nonverbal at baseline but follows commands  Nausea, vomiting, abdominal pain -CT abdomen pelvis pending   DVT prophylaxis: Coumadin Code Status: DNR Family Communication: no family at bedside Disposition Plan: remain in SDU  Consultants:   None   Procedures:   None   Antimicrobials:  Vancomycin 7/16 >> 7/16  Zosyn 7/16 >>  Azithromycin 7/16 >>   Subjective: -Appears alert, eyes open, noncommunicative, follows commands  Objective: Vitals:   04/21/17 0600 04/21/17 0700 04/21/17 0750 04/21/17 1000  BP: (!) 114/40 (!) 116/42 (!) 117/44 (!) 118/36  Pulse: 72 67 70 67  Resp: (!) 22 (!) 23 (!) 24 (!) 38  Temp:   98.3 F (36.8 C)   TempSrc:   Axillary   SpO2: 100% 100% 99% 100%  Weight:      Height:        Intake/Output Summary (Last 24 hours) at 04/21/17 1039 Last data filed at 04/21/17 1000  Gross per 24 hour  Intake          3121.67 ml  Output              175 ml  Net          2946.67 ml   Filed Weights   04/21/17 0109  Weight: 77.4 kg (170 lb 10.2 oz)    Examination:  Vitals:   04/21/17 0600 04/21/17 0700 04/21/17 0750 04/21/17 1000  BP: (!) 114/40 (!) 116/42 (!) 117/44 (!) 118/36  Pulse: 72 67 70 67  Resp: (!) 22 (!) 23 (!) 24 (!) 38  Temp:   98.3 F (36.8 C)   TempSrc:   Axillary   SpO2: 100% 100% 99% 100%  Weight:      Height:        Constitutional: Appears somewhat restless Eyes: lids and conjunctivae normal Respiratory: coarse breath sounds, no wheezing.  Increased respiratory effort Cardiovascular: Regular rate and rhythm, no murmurs / rubs / gallops. + LE edema. 2+ pedal pulses. Abdomen: no tenderness. Bowel sounds positive.  Musculoskeletal: no clubbing / cyanosis. Decreased muscle tone.  Skin: no rashes, lesions, ulcers. No  induration Neurologic: follows commands, moves all 4, strength appears equal but overall decreased   Data Reviewed: I have independently reviewed following labs and imaging studies   CBC:  Recent Labs Lab 04/20/17 2135 04/21/17 0149  WBC 22.6* 18.3*  NEUTROABS 19.5* 16.3*  HGB 9.8* 8.1*  HCT 29.8* 25.1*  MCV 102.4* 100.4*  PLT 328 235   Basic Metabolic Panel:  Recent Labs Lab 04/20/17 2135 04/21/17 0149  NA 136 138  K 4.0 3.9  CL 100* 105  CO2 18* 23  GLUCOSE 218* 158*  BUN 25* 21*  CREATININE 1.32* 1.38*  CALCIUM 9.1 8.2*   GFR: Estimated Creatinine Clearance: 40 mL/min (A) (by C-G formula based on SCr of 1.38 mg/dL (H)). Liver Function Tests:  Recent Labs Lab 04/21/17 0149  AST 38  ALT 23  ALKPHOS 65  BILITOT 1.2  PROT 6.5  ALBUMIN 3.1*    Recent Labs Lab 04/21/17 0149  LIPASE 72*   No results for input(s): AMMONIA in the last 168 hours. Coagulation Profile:  Recent Labs Lab 04/20/17 2135 04/21/17 0723  INR 6.64* 7.71*   Cardiac Enzymes:  Recent Labs Lab 04/20/17 2135 04/21/17 0149 04/21/17 0723  TROPONINI 0.05* 0.29* 0.76*   BNP (last 3 results) No results for input(s): PROBNP in the last 8760 hours. HbA1C: No results for input(s): HGBA1C in the last 72 hours. CBG: No results for input(s): GLUCAP in the last 168 hours. Lipid Profile: No results for input(s): CHOL, HDL, LDLCALC, TRIG, CHOLHDL, LDLDIRECT in the last 72 hours. Thyroid Function Tests: No results for input(s): TSH, T4TOTAL, FREET4, T3FREE, THYROIDAB in the last 72 hours. Anemia Panel: No results for input(s): VITAMINB12, FOLATE, FERRITIN, TIBC, IRON, RETICCTPCT in the last 72 hours. Urine analysis:    Component Value Date/Time   COLORURINE YELLOW 04/21/2017 0403   APPEARANCEUR HAZY (A) 04/21/2017 0403   LABSPEC 1.029 04/21/2017 0403   PHURINE 5.0 04/21/2017 0403   GLUCOSEU NEGATIVE 04/21/2017 0403   HGBUR SMALL (A) 04/21/2017 0403   BILIRUBINUR NEGATIVE  04/21/2017 0403   KETONESUR NEGATIVE 04/21/2017 0403   PROTEINUR 100 (A) 04/21/2017 0403   UROBILINOGEN 0.2 12/06/2012 1540   NITRITE NEGATIVE 04/21/2017 0403   LEUKOCYTESUR NEGATIVE 04/21/2017 0403   Sepsis Labs: Invalid input(s): PROCALCITONIN, LACTICIDVEN  Recent Results (from the past 240 hour(s))  MRSA PCR Screening     Status: None   Collection Time: 04/21/17  1:16 AM  Result Value Ref Range Status   MRSA by PCR NEGATIVE NEGATIVE Final    Comment:        The GeneXpert MRSA Assay (FDA approved for NASAL specimens only), is one component of a comprehensive MRSA colonization surveillance program. It is not intended to diagnose MRSA infection nor to guide or monitor treatment for MRSA infections.       Radiology Studies: Dg Chest Portable 1 View  Result Date: 04/20/2017 CLINICAL DATA:  Acute onset of respiratory difficulty.  Trampoline. EXAM: PORTABLE CHEST 1 VIEW COMPARISON:  The heart size is normal. Atherosclerotic calcifications are present at the aortic arch. FINDINGS: Heart is enlarged. Aortic atherosclerosis is present. Right lower lobe airspace disease is superimposed on chronic interstitial coarsening. Mild edema is present as well. Chronic right pleural thickening is again noted. IMPRESSION: 1. Right lower lobe airspace disease concerning for lobar pneumonia. 2. Chronic right pleural thickening. 3. Cardiac enlargement and mild edema. Electronically Signed   By: Marin Roberts M.D.   On: 04/20/2017 21:52     Scheduled Meds: . cholecalciferol  1,000 Units Oral QHS  . donepezil  10 mg Oral QHS  . escitalopram  10 mg Oral Daily  . folic acid  0.5 mg Oral Daily  . gabapentin  300 mg Oral QHS  . magnesium oxide  200 mg Oral Daily  . multivitamin with minerals  1 tablet Oral Daily  . pravastatin  40 mg Oral QPM  . sulfaSALAzine  500 mg Oral BID  . tamsulosin  0.4 mg Oral QPC supper   Continuous Infusions: . sodium chloride 100 mL/hr at 04/21/17 0732  .  azithromycin Stopped (04/21/17 0414)  . phytonadione (VITAMIN K) IV 1 mg (04/21/17 0959)  . sodium chloride Stopped (04/21/17 0147)    Pamella Pert, MD, PhD [facility] Pager [phone]  If 7PM-7AM, please contact night-coverage [email or web address] Password [facility] 04/21/2017, 10:39 AM

## 2017-04-21 NOTE — Progress Notes (Signed)
ANTICOAGULATION CONSULT NOTE - Initial Consult  Pharmacy Consult for INR Indication: atrial fibrillation  Allergies  Allergen Reactions  . Nebivolol Anaphylaxis  . Tramadol Nausea Only    Other reaction(s): NAUSEA   Patient Measurements: Height: 5' 11.5" (181.6 cm) Weight: 170 lb 10.2 oz (77.4 kg) IBW/kg (Calculated) : 76.45  Assessment: 83 yo M presents on 7/15 with c/o of vomiting and shaking. On Coumadin PTA for Afib. INR on admit elevated at 6.64. Hgb 9.8, plts wnl.  Goal of Therapy:  INR 2-3 Monitor platelets by anticoagulation protocol: Yes  Plan:  Hold Coumadin tonight Will wait for new INR on 7/17, CBC, s/s of bleed  Caleb Delgado, PharmD, [facility]PS Clinical Pharmacist Pager [phone] 04/21/2017 1:51 AM

## 2017-04-21 NOTE — Progress Notes (Signed)
CRITICAL VALUE ALERT  Critical Value:  INR 7.71 and lactic acid of 2.1  Date & Time Notied: 04/21/17  Provider Notified: Dr. Elvera LennoxGherghe  Orders Received/Actions taken: Vit K  IV was ordered

## 2017-04-21 NOTE — Plan of Care (Signed)
Problem: Respiratory: Goal: Ability to maintain adequate ventilation will improve Outcome: Progressing Discussed with patient importance of keeping his oxygen on with some teach back displayed

## 2017-04-21 NOTE — Plan of Care (Signed)
Problem: Education: Goal: Knowledge of [facility] Education information/materials will improve Outcome: Progressing Discussed with patient and family about plan of care and pain management for the evening with some teach back displayed.  Comments: Topics:  Pain management, IVF, labs and medications.

## 2017-04-21 NOTE — Evaluation (Signed)
Clinical/Bedside Swallow Evaluation Patient Details  Name: Caleb Delgado MRN: [id number] Date of Birth: 1926/08/20  Today's Date: 04/21/2017 Time: SLP Start Time (ACUTE ONLY): 1040 SLP Stop Time (ACUTE ONLY): 1057 SLP Time Calculation (min) (ACUTE ONLY): 17 min  Past Medical History:  Past Medical History:  Diagnosis Date  . Atrial fibrillation (HCC)   . BPH (benign prostatic hyperplasia)   . CAD (coronary artery disease)   . Expressive aphasia   . HTN (hypertension)   . Other and unspecified hyperlipidemia   . S/P AVR (aortic valve replacement)   . Stroke [facility])    TIA  . Ulcerative colitis (HCC)   . Unspecified sinusitis (chronic)    Past Surgical History:  Past Surgical History:  Procedure Laterality Date  . AORTIC VALVE REPAIR    . CARDIAC SURGERY    . LUNG SURGERY     Thoracotomy for polyps  . TEE WITHOUT CARDIOVERSION N/A 12/08/2012   Procedure: TRANSESOPHAGEAL ECHOCARDIOGRAM (TEE);  Surgeon: Caleb Bunting, MD;  Location: [facility] ENDOSCOPY;  Service: Cardiovascular;  Laterality: N/A;   HPI:  Caleb Fabian Hughesis a 83 y.o.malewith medical history significant of hypertension, hyperlipidemia, depression, ulcerative colitis, s/p of AVR (biovalve), CAD, s/p of CABG, dCHF, dementia, stroke, nonverbal status, BPH, atrial fibrillation on Coumadin, who presents with shortness of breath, cough, nausea, vomiting and abdominal pain. He vomited 4 times without blood in the vomitus. Chest x-ray showed left lower lobe infiltration, indicating CAP. Patient has nausea, vomiting, indicating possible aspiration pneumonia too. Patient has a severe sepsis with lactic acid 8.38, leukocytosis, tachycardia and tachypnea. Pt is also fluid overloaded.    Assessment / Plan / Recommendation Clinical Impression  Pt demonstrates immediate weak coughing following trials of thin liquids. Suspect decreased oral cohesion of bolus, delayed initiation with probable aspiration before the swallow. Pt  tolerated trials of puree relatively well though suspect possible residuals post swallow. Pts respiratory status is fragile, O2 sats drop with 20 seconds off ventimask. Recommend pt remain NPO except for meds crushed in puree. Will proceed with MBS tomorrow for objective assessment of swallowing.  SLP Visit Diagnosis: Dysphagia, oropharyngeal phase (R13.12)    Aspiration Risk  Severe aspiration risk    Diet Recommendation NPO except meds   Liquid Administration via: Spoon Medication Administration: Crushed with puree    Other  Recommendations Oral Care Recommendations: Oral care QID   Follow up Recommendations Skilled Nursing facility      Frequency and Duration            Prognosis        Swallow Study   General HPI: Caleb Hiltner Hughesis a 83 y.o.malewith medical history significant of hypertension, hyperlipidemia, depression, ulcerative colitis, s/p of AVR (biovalve), CAD, s/p of CABG, dCHF, dementia, stroke, nonverbal status, BPH, atrial fibrillation on Coumadin, who presents with shortness of breath, cough, nausea, vomiting and abdominal pain. He vomited 4 times without blood in the vomitus. Chest x-ray showed left lower lobe infiltration, indicating CAP. Patient has nausea, vomiting, indicating possible aspiration pneumonia too. Patient has a severe sepsis with lactic acid 8.38, leukocytosis, tachycardia and tachypnea. Pt is also fluid overloaded.  Type of Study: Bedside Swallow Evaluation Diet Prior to this Study: NPO Temperature Spikes Noted: No Respiratory Status: Venti-mask History of Recent Intubation: No Behavior/Cognition: Alert;Cooperative;Requires cueing;Doesn't follow directions Oral Cavity Assessment: Within Functional Limits Oral Care Completed by SLP: No Oral Cavity - Dentition: Adequate natural dentition Self-Feeding Abilities: Needs assist Patient Positioning: Upright in bed Baseline Vocal Quality: Low  vocal intensity Volitional Cough: Weak Volitional  Swallow: Unable to elicit    Oral/Motor/Sensory Function Overall Oral Motor/Sensory Function: Generalized oral weakness (does not follow commands - aphasia)   Ice Chips     Thin Liquid Thin Liquid: Impaired Presentation: Straw Oral Phase Impairments: Reduced lingual movement/coordination Pharyngeal  Phase Impairments: Cough - Immediate;Suspected delayed Swallow    Nectar Thick Nectar Thick Liquid: Not tested   Honey Thick Honey Thick Liquid: Not tested   Puree Puree: Impaired Presentation: Spoon Oral Phase Impairments: Reduced lingual movement/coordination Oral Phase Functional Implications: Prolonged oral transit Pharyngeal Phase Impairments: Suspected delayed Swallow   Solid   Caleb Delgado           Caleb DittyBonnie Delorse Shane, MA CCC-SLP [phone]  Caleb Delgado, Caleb Delgado 04/21/2017,11:00 AM

## 2017-04-21 NOTE — H&P (Addendum)
History and Physical    Caleb Delgado NWG:[id number] DOB: 1926-07-24 DOA: 04/20/2017  Referring MD/NP/PA:   PCP: Loyal Jacobson, MD   Patient coming from:  The patient is coming from home.  At baseline, pt is dependent for most of ADL.   Chief Complaint: Shortness of breath cough, nausea, vomiting, abdominal pain  HPI: Caleb Delgado is a 83 y.o. male with medical history significant of hypertension, hyperlipidemia, depression, ulcerative colitis, s/p of AVR (biovalve), CAD, s/p of CABG, dCHF, dementia, stroke, nonverbal status, BPH, atrial fibrillation on Coumadin, who presents with shortness of breath, cough, nausea, vomiting and abdominal pain.  Per patient's family, patient developed nausea, vomiting and abdominal pain in this afternoon. He vomited 4 times without blood in the vomitus. Patient does not have diarrhea. His abdomen is located in the upper abdomen. Due to nonverbal status, patient cannot provide detailed information about abdominal pain. Patient also has shortness of breath and dry cough. He has fever and chills. Patient moves all extremities. No facial droop.  ED Course: pt was found to have WBC 22.6, lactic acid 8.38, INR 6.64, BNP 1504.9, pending urinalysis, stable renal function, temperature 99.6, tachycardia, tachypnea, O2 sat 83% on room air. ABG with pH of 7.409, PCO2 32.8, PO2 64, chest x-ray showed R lower lobe infiltration. Patient is admitted to stepdown as inpatient.  Review of Systems: Could not be reviewed accurately due to nonverbal status and dementia.  Allergy:  Allergies  Allergen Reactions  . Nebivolol Anaphylaxis  . Tramadol Nausea Only    Other reaction(s): NAUSEA    Past Medical History:  Diagnosis Date  . Atrial fibrillation (HCC)   . BPH (benign prostatic hyperplasia)   . CAD (coronary artery disease)   . Expressive aphasia   . HTN (hypertension)   . Other and unspecified hyperlipidemia   . S/P AVR (aortic valve replacement)     . Stroke [facility])    TIA  . Ulcerative colitis (HCC)   . Unspecified sinusitis (chronic)     Past Surgical History:  Procedure Laterality Date  . AORTIC VALVE REPAIR    . CARDIAC SURGERY    . LUNG SURGERY     Thoracotomy for polyps  . TEE WITHOUT CARDIOVERSION N/A 12/08/2012   Procedure: TRANSESOPHAGEAL ECHOCARDIOGRAM (TEE);  Surgeon: Lewayne Bunting, MD;  Location: [facility] ENDOSCOPY;  Service: Cardiovascular;  Laterality: N/A;    Social History:  reports that he quit smoking about 46 years ago. He has never used smokeless tobacco. He reports that he drinks alcohol. He reports that he does not use drugs.  Family History:  Family History  Problem Relation Age of Onset  . Stroke Mother   . Diabetes Mother   . Stroke Father      Prior to Admission medications   Medication Sig Start Date End Date Taking? Authorizing Provider  Coenzyme Q10 (CO Q 10 PO) Take by mouth.   Yes [provider]  albuterol (PROVENTIL HFA;VENTOLIN HFA) 108 (90 BASE) MCG/ACT inhaler Inhale 1-2 puffs into the lungs every 6 (six) hours as needed for wheezing or shortness of breath.    [provider]  amLODipine (NORVASC) 10 MG tablet Take 1 tablet (10 mg total) by mouth daily. 06/05/16   Lewayne Bunting, MD  Cholecalciferol 1000 UNITS TBDP Take 1,000 Units by mouth at bedtime.    [provider]  donepezil (ARICEPT) 10 MG tablet Take 10 mg by mouth at bedtime.    [provider]  escitalopram Judye Bos)  10 MG tablet Take 10 mg by mouth daily.  10/19/16   [provider]  folic acid (FOLVITE) 0.5 MG tablet Take 0.5 mg by mouth daily.    [provider]  furosemide (LASIX) 20 MG tablet Take 1 tablet (20 mg total) by mouth daily. 06/13/16   Lewayne Bunting, MD  gabapentin (NEURONTIN) 300 MG capsule Take 300 mg by mouth at bedtime.  05/20/13   [provider]  Magnesium 250 MG TABS Take 250 mg by mouth daily.    [provider]  Multiple Vitamin  (MULTIVITAMIN) tablet Take 1 tablet by mouth daily.    [provider]  potassium chloride (K-DUR) 10 MEQ tablet Take 1 tablet (10 mEq total) by mouth once. 06/13/16 10/30/16  Lewayne Bunting, MD  pravastatin (PRAVACHOL) 40 MG tablet TAKE 1 TABLET (40 MG TOTAL) BY MOUTH EVERY EVENING. 03/17/17   Lewayne Bunting, MD  sulfaSALAzine (AZULFIDINE) 500 MG tablet Take 500 mg by mouth 2 (two) times daily.  01/02/14   [provider]  tamsulosin (FLOMAX) 0.4 MG CAPS Take 0.4 mg by mouth daily after supper.    [provider]  traZODone (DESYREL) 50 MG tablet Take 50 mg by mouth at bedtime.    [provider]  warfarin (COUMADIN) 5 MG tablet TAKE AS DIRECTED Patient taking differently: Take 2.5-5 mg by mouth daily at 6 PM. Takes 5mg  on Tues, Thurs and Sat Takes 2.5mg  all other days 06/16/14   Lewayne Bunting, MD    Physical Exam: Vitals:   04/20/17 2315 04/20/17 2330 04/20/17 2345 04/21/17 0109  BP: (!) 129/41 (!) 128/47 (!) 123/47 (!) 128/49  Pulse: 92 88 85 84  Resp: 16 19  19   Temp:    97.8 F (36.6 C)  TempSrc:    Oral  SpO2: 90% 92% 94% 93%  Weight:    77.4 kg (170 lb 10.2 oz)  Height:    5' 11.5" (1.816 m)   General: Not in acute distress HEENT:       Eyes: PERRL, EOMI, no scleral icterus.       ENT: No discharge from the ears and nose, no pharynx injection, no tonsillar enlargement.        Neck: positive JVD, no bruit, no mass felt. Heme: No neck lymph node enlargement. Cardiac: S1/S2, RRR, 2/6 systolic murmurs, No gallops or rubs. Respiratory:  No rales, wheezing, rhonchi or rubs. GI: Soft, nondistended, has tenderness diffusely, no rebound pain, no organomegaly, BS present. GU: No hematuria Ext: 1+ pitting leg edema bilaterally. 2+DP/PT pulse bilaterally. Musculoskeletal: No joint deformities, No joint redness or warmth, no limitation of ROM in spin. Skin: No rashes.  Neuro: Alert, oriented X3, cranial nerves II-XII grossly intact, moves all  extremities normally.  Psych: Patient is not psychotic, no suicidal or hemocidal ideation.  Labs on Admission: I have personally reviewed following labs and imaging studies  CBC:  Recent Labs Lab 04/20/17 2135  WBC 22.6*  NEUTROABS 19.5*  HGB 9.8*  HCT 29.8*  MCV 102.4*  PLT 328   Basic Metabolic Panel:  Recent Labs Lab 04/20/17 2135  NA 136  K 4.0  CL 100*  CO2 18*  GLUCOSE 218*  BUN 25*  CREATININE 1.32*  CALCIUM 9.1   GFR: Estimated Creatinine Clearance: 41.9 mL/min (A) (by C-G formula based on SCr of 1.32 mg/dL (H)). Liver Function Tests: No results for input(s): AST, ALT, ALKPHOS, BILITOT, PROT, ALBUMIN in the last 168 hours. No results for  input(s): LIPASE, AMYLASE in the last 168 hours. No results for input(s): AMMONIA in the last 168 hours. Coagulation Profile:  Recent Labs Lab 04/20/17 2135  INR 6.64*   Cardiac Enzymes:  Recent Labs Lab 04/20/17 2135  TROPONINI 0.05*   BNP (last 3 results) No results for input(s): PROBNP in the last 8760 hours. HbA1C: No results for input(s): HGBA1C in the last 72 hours. CBG: No results for input(s): GLUCAP in the last 168 hours. Lipid Profile: No results for input(s): CHOL, HDL, LDLCALC, TRIG, CHOLHDL, LDLDIRECT in the last 72 hours. Thyroid Function Tests: No results for input(s): TSH, T4TOTAL, FREET4, T3FREE, THYROIDAB in the last 72 hours. Anemia Panel: No results for input(s): VITAMINB12, FOLATE, FERRITIN, TIBC, IRON, RETICCTPCT in the last 72 hours. Urine analysis:    Component Value Date/Time   COLORURINE YELLOW 11/10/2016 0745   APPEARANCEUR CLEAR 11/10/2016 0745   LABSPEC 1.019 11/10/2016 0745   PHURINE 6.0 11/10/2016 0745   GLUCOSEU NEGATIVE 11/10/2016 0745   HGBUR NEGATIVE 11/10/2016 0745   BILIRUBINUR NEGATIVE 11/10/2016 0745   KETONESUR NEGATIVE 11/10/2016 0745   PROTEINUR 30 (A) 11/10/2016 0745   UROBILINOGEN 0.2 12/06/2012 1540   NITRITE NEGATIVE 11/10/2016 0745   LEUKOCYTESUR  NEGATIVE 11/10/2016 0745   Sepsis Labs: @LABRCNTIP (procalcitonin:4,lacticidven:4) )No results found for this or any previous visit (from the past 240 hour(s)).   Radiological Exams on Admission: Dg Chest Portable 1 View  Result Date: 04/20/2017 CLINICAL DATA:  Acute onset of respiratory difficulty.  Trampoline. EXAM: PORTABLE CHEST 1 VIEW COMPARISON:  The heart size is normal. Atherosclerotic calcifications are present at the aortic arch. FINDINGS: Heart is enlarged. Aortic atherosclerosis is present. Right lower lobe airspace disease is superimposed on chronic interstitial coarsening. Mild edema is present as well. Chronic right pleural thickening is again noted. IMPRESSION: 1. Right lower lobe airspace disease concerning for lobar pneumonia. 2. Chronic right pleural thickening. 3. Cardiac enlargement and mild edema. Electronically Signed   By: Marin Roberts M.D.   On: 04/20/2017 21:52     EKG: Independently reviewed.  Atrial fibrillation, tachycardia, QTC 507.   Assessment/Plan Principal Problem:   Acute on chronic respiratory failure with hypoxia (HCC) Active Problems:   Expressive aphasia   HTN (hypertension)   S/P AVR (aortic valve replacement)   Atrial fibrillation (HCC)   CAD (coronary artery disease)   Chronic diastolic CHF (congestive heart failure) (HCC)   CKD (chronic kidney disease), stage III   Sepsis (HCC)   CAP (community acquired pneumonia)   Ulcerative colitis (HCC)   Stroke (HCC)   Abdominal pain   Aspiration pneumonia (HCC)   Acute on chronic respiratory failure with hypoxia due to CAP vs. aspiration pneumonia and sepsis: Chest x-ray showed left lower lobe infiltration, indicating right lower lobar CAP. Patient has nausea, vomiting, indicating possible aspiration pneumonia too. Patient has a severe sepsis with lactic acid 8.38, leukocytosis, tachycardia and tachypnea. Currently hemodynamically stable.  - Will admit to SDU - IV Vancomycin and Zosyn and  azithromycin - Mucinex for cough  - prn Albuterol Nebs for SOB - Urine legionella and S. pneumococcal antigen - Follow up blood culture x2, sputum culture and respiratory virus panel - will get Procalcitonin and trend lactic acid level per sepsis protocol - IVF: 2.75 L of NS bolus in ED. After this, will hold off IVF, since pt already has fluid overload with BNP 1504 and positive JVD. - get SLP  Chronic diastolic CHF (congestive heart failure): 2-D echo on 08/30/15 showed EF of  55-60%. Patient has fluid overload now, with elevated BNP 1504, positive JVD and 1+ leg edema. -will hold off IV lasix due to severe sepsis -will start IV lasix as soon as sepsis resolves  Atrial Fibrillation: CHA2DS2-VASc Score is 5, needs oral anticoagulation. Patient is on Coumadin at home. INR is supratherapeutic 6.64 without bleeding on admission. Heart rate is controlled. Not on CCB or BB -continue coumadin per pharm -tele monitoring  HTN: -hold amlodipine and Lasix since patient is at risk of developing hypotension -IV hydralazine when necessary  CAD (coronary artery disease): trop is 0.05-->0.29. No CP. Most likely due to demand ischemia secondary to sepsis and CHF exacerbation -Continue pravastatin -trend trop  CKD-III: stable. Baseline creatinine 1.2-1.5. His creatinine is 1.32 -f/u renal function by BMP  Ulcerative colitis (HCC): -continue sulfasalazine  hx of Stroke [facility](HCC): Nonverbal status. -continue pravastatin  Nausea, vomiting and Abdominal pain: Etiology is not clear. -Check lipase -CT abdomen/pelvis -When necessary hydroxyzine for nausea    DVT ppx: on Coumadin Code Status: DNR (I discussed with patient in the presence of his family, expalained the meaning of CODE STATUS. Patient wants to be DNR)  Family Communication:   Yes, patient's daughter and son, daughter-in-law  at bed side Disposition Plan:  Anticipate discharge back to previous home environment Consults called:   none Admission status:  SDU/inpation       Date of Service 04/21/2017    Lorretta HarpIU, Jarek Longton [facility] Pager [phone]  If 7PM-7AM, please contact night-coverage [email or web address] Password [email or web address] 04/21/2017, 2:02 AM

## 2017-04-22 ENCOUNTER — Other Ambulatory Visit (HOSPITAL_COMMUNITY): Payer: Medicare Other

## 2017-04-22 ENCOUNTER — Inpatient Hospital Stay (HOSPITAL_COMMUNITY): Payer: Medicare Other

## 2017-04-22 LAB — CBC
HEMATOCRIT: 26.3 % — AB (ref 39.0–52.0)
Hemoglobin: 8.3 g/dL — ABNORMAL LOW (ref 13.0–17.0)
MCH: 31.9 pg (ref 26.0–34.0)
MCHC: 31.6 g/dL (ref 30.0–36.0)
MCV: 101.2 fL — AB (ref 78.0–100.0)
PLATELETS: 207 10*3/uL (ref 150–400)
RBC: 2.6 MIL/uL — AB (ref 4.22–5.81)
RDW: 15.2 % (ref 11.5–15.5)
WBC: 14 10*3/uL — AB (ref 4.0–10.5)

## 2017-04-22 LAB — COMPREHENSIVE METABOLIC PANEL
ALBUMIN: 3.2 g/dL — AB (ref 3.5–5.0)
ALT: 36 U/L (ref 17–63)
AST: 46 U/L — AB (ref 15–41)
Alkaline Phosphatase: 63 U/L (ref 38–126)
Anion gap: 8 (ref 5–15)
BUN: 25 mg/dL — AB (ref 6–20)
CHLORIDE: 104 mmol/L (ref 101–111)
CO2: 25 mmol/L (ref 22–32)
CREATININE: 1.46 mg/dL — AB (ref 0.61–1.24)
Calcium: 8.9 mg/dL (ref 8.9–10.3)
GFR calc Af Amer: 48 mL/min — ABNORMAL LOW (ref >60)
GFR, EST NON AFRICAN AMERICAN: 41 mL/min — AB (ref >60)
Glucose, Bld: 143 mg/dL — ABNORMAL HIGH (ref 65–99)
POTASSIUM: 4 mmol/L (ref 3.5–5.1)
SODIUM: 137 mmol/L (ref 135–145)
Total Bilirubin: 1.5 mg/dL — ABNORMAL HIGH (ref 0.3–1.2)
Total Protein: 6.9 g/dL (ref 6.5–8.1)

## 2017-04-22 LAB — PROTIME-INR
INR: 2.46
PROTHROMBIN TIME: 27.1 s — AB (ref 11.4–15.2)

## 2017-04-22 MED ORDER — RESOURCE THICKENUP CLEAR PO POWD
ORAL | Status: DC | PRN
  Filled 2017-04-22: qty 125

## 2017-04-22 NOTE — Progress Notes (Signed)
Modified Barium Swallow Progress Note  Patient Details  Name: Caleb Delgado MRN: [id number] Date of Birth: 12/20/1925  Today's Date: 04/22/2017  Modified Barium Swallow completed.  Full report located under Chart Review in the Imaging Section.  Brief recommendations include the following:  Clinical Impression  Pt demonstrates a moderate oral and oropharyngeal dysphagia with lingual pumping and prolonged bolus formation but adequate mastication of solids and appropriate ability to swallow a pill whole in puree. There is mild base of tongue weakness leading to base of tongue and vallecular residuals. Primary problem is slightly sluggish hyoid excursion in setting of timely swallow initiation, leading to incomplete closure of vestibule as nectar and thin boluses pass. There is silent penetration/aspiration of both textures, accumulating and eventually leading to significant aspriation events given total lack of tracheal sensation of aspirate. Due to aphasia pt was unable to complete a volitional throat clear or cough, which would have likely helped tremendously. Total assist for a chin tuck also ineffective. For now, pt is recommended to consume honey thick liquids and dys 3 solids. SLP to attempt training in volitional throat clearing. May also benefit from a discussion with family about PO for comfort as honey thick liquids are highly restrictive and likely to result in dehydration with long term use.    Swallow Evaluation Recommendations       SLP Diet Recommendations: Dysphagia 3 (Mech soft) solids;Honey thick liquids   Liquid Administration via: Cup;Spoon   Medication Administration: Whole meds with puree   Supervision: Patient able to self feed;Intermittent supervision to cue for compensatory strategies   Compensations: Slow rate;Small sips/bites;Clear throat intermittently;Hard cough after swallow   Postural Changes: Remain semi-upright after after feeds/meals (Comment)   Oral  Care Recommendations: Oral care BID   Other Recommendations: Order thickener from pharmacy   [facility]oscopy Center LLCBonnie Krystiana Fornes, MA CCC-SLP [phone]  Claudine MoutonDeBlois, Caleb Delgado 04/22/2017,9:33 AM

## 2017-04-22 NOTE — Progress Notes (Signed)
ANTICOAGULATION CONSULT NOTE - Follow Up Consult  Pharmacy Consult for Heparin (when INR<2) Indication: Afib  Patient Measurements: Height: 5' 11.5" (181.6 cm) Weight: 170 lb 10.2 oz (77.4 kg) IBW/kg (Calculated) : 76.45 Heparin Dosing Weight: 77 kg  Vital Signs: Temp: 98.6 F (37 C) (07/17 0730) Temp Source: Oral (07/17 0730) BP: 139/45 (07/17 1100) Pulse Rate: 65 (07/17 1122)  Labs:  Recent Labs  04/20/17 2135 04/21/17 0149 04/21/17 0723 04/21/17 1415 04/22/17 0519  HGB 9.8* 8.1*  --   --  8.3*  HCT 29.8* 25.1*  --   --  26.3*  PLT 328 235  --   --  207  LABPROT 60.0*  --  67.6*  --  27.1*  INR 6.64*  --  7.71*  --  2.46  CREATININE 1.32* 1.38*  --   --  1.46*  TROPONINI 0.05* 0.29* 0.76* 0.68*  --     Estimated Creatinine Clearance: 37.8 mL/min (A) (by C-G formula based on SCr of 1.46 mg/dL (H)).   Assessment: 89 YOM on warfarin PTA for hx Afib (also with hx tissue AVR) who presented with abdominal pain. INR elevated at 6.64 on admission - now down to 2.46 after Vit K given on 7/16. Warfarin will continue to be held due to imaging showing cholelithiasis for possible surgery/intervention plans. Pharmacy consulted to start Heparin when INR <2.   Goal of Therapy:  Heparin level 0.3-0.7 units/ml Monitor platelets by anticoagulation protocol: Yes   Plan:  1. Warfarin held 2. No heparin needed today due to INR>2 3. Will f/u daily PT/INR and plan to initiate heparin drip when INR <2  Thank you for allowing pharmacy to be a part of this patient's care.  Georgina PillionElizabeth Khilee Hendricksen, PharmD, BCPS Clinical Pharmacist Pager: [phone] Clinical phone for 04/22/2017 from 7a-3:30p: [phone] If after 3:30p, please call main pharmacy at: [phone] 04/22/2017 11:34 AM

## 2017-04-22 NOTE — Progress Notes (Signed)
PROGRESS NOTE  Caleb NegusFred Carlton Delgado NWG:[id number] DOB: 12/10/1925 DOA: 04/20/2017 PCP: Loyal JacobsonKalish, Michael, MD   LOS: 2 days   Brief Narrative / Interim history: 83 year old male with hypertension, hyperlipidemia, depression, UC, by of valve AVR, coronary artery disease with history of CABG, diastolic CHF, dementia, stroke, nonverbal status, A. fib on Coumadin who was admitted on 7/16 with hypoxic respiratory failure in the setting of probable aspiration pneumonia.  He has had no nausea vomiting or abdominal pain prior to coming to the hospital with several emesis episodes.   Assessment & Plan: Principal Problem:   Acute on chronic respiratory failure with hypoxia (HCC) Active Problems:   Expressive aphasia   HTN (hypertension)   S/P AVR (aortic valve replacement)   Atrial fibrillation (HCC)   CAD (coronary artery disease)   Chronic diastolic CHF (congestive heart failure) (HCC)   CKD (chronic kidney disease), stage III   Sepsis (HCC)   CAP (community acquired pneumonia)   Ulcerative colitis (HCC)   Stroke (HCC)   Abdominal pain   Aspiration pneumonia (HCC)  Acute on chronic hypoxic respiratory failure due to aspiration pneumonia, right lower lobe pneumonia, sepsis -His aspiration pneumonia is likely due to #2 with nausea and vomiting -Placed on vancomycin, Zosyn and azithromycin. -Continue Zosyn and azithromycin -Respiratory virus panel sent on admission, negative -Oxygen support as needed, was able to be weaned off the Ventimask and he is on 5 L nasal cannula this morning, this appears to be improving  Nausea, vomiting, abdominal pain -CT abdomen pelvis showed cholelithiasis with thickened appearance of the gallbladder wall, which may be the cause for his nausea and vomiting.  Patient is nonverbal and cannot tell if he has abdominal pain, however does appear to be in pain on right upper quadrant exam.  Obtain right upper quadrant ultrasound, if concern for acute cholecystitis on  imaging will need surgical consultation  Acute on chronic diastolic CHF -2D echo in 2016 with an EF of 55-60%.  Appears with mild fluid overload, hold off Lasix in the setting of sepsis. -Received IV fluid resuscitation in the ED, for now hold fluids and allow p.o. intake  Atrial fibrillation -CHA2DS2-VASc Score is 5 -His Coumadin was held, his INR was 6.6 increasing to 7.7 yesterday, received 1 mg of vitamin K, INR is 2.5 this morning. -Hold Coumadin for now, start IV heparin if INR tomorrow is less than 2 in case he will need some procedure for his possible cholecystitis, discussed with pharmacy.  Supratherapeutic INR -Vitamin K 1 mg 1 on 7/16 INR this morning at 2.5  Hypertension -Hold Norvasc and Lasix still  Coronary artery disease -No reported chest pain, troponin elevation likely due to demand ischemia in the setting of sepsis and acute on chronic CHF -Troponin peaked on 7/16  Chronic kidney disease stage III -Creatinine is at baseline, continue to follow  Ulcerative colitis -Continue sulfasalazine  Prior history of CVA -Continue statin, he is nonverbal at baseline but follows commands  Dementia -Failed swallow evaluation on 7/16, however fortunately pass today, he is on a dysphagia 3 diet  DVT prophylaxis: Coumadin Code Status: DNR Family Communication: Discussed with son at bedside Disposition Plan: remain in SDU  Consultants:   None   Procedures:   None   Antimicrobials:  Vancomycin 7/16 >> 7/16  Zosyn 7/16 >>  Azithromycin 7/16 >>   Subjective: -Appears much improved this morning, he is alert, smiling and eating breakfast  Objective: Vitals:   04/22/17 0600 04/22/17 0730 04/22/17 1100 04/22/17 1122  BP: (!) 129/49 134/77 (!) 139/45   Pulse: 70 81 80 65  Resp: (!) 30 (!) 31 (!) 39 15  Temp:  98.6 F (37 C)    TempSrc:  Oral    SpO2: 96% 97% 98% 99%  Weight:      Height:        Intake/Output Summary (Last 24 hours) at 04/22/17  1150 Last data filed at 04/22/17 1102  Gross per 24 hour  Intake              575 ml  Output              400 ml  Net              175 ml   Filed Weights   04/21/17 0109  Weight: 77.4 kg (170 lb 10.2 oz)    Examination:  Vitals:   04/22/17 0600 04/22/17 0730 04/22/17 1100 04/22/17 1122  BP: (!) 129/49 134/77 (!) 139/45   Pulse: 70 81 80 65  Resp: (!) 30 (!) 31 (!) 39 15  Temp:  98.6 F (37 C)    TempSrc:  Oral    SpO2: 96% 97% 98% 99%  Weight:      Height:        Constitutional: NAD, calm, comfortable Eyes: lids and conjunctivae normal Respiratory: clear to auscultation bilaterally, no wheezing, no crackles. Normal respiratory effort.  Cardiovascular: Regular rate and rhythm, no murmurs / rubs / gallops. No LE edema. 2+ pedal pulses.  Abdomen: + tenderness RUQ. Bowel sounds positive.  Skin: no rashes, lesions, ulcers. No induration Neurologic: non focal, moves all 4   Data Reviewed: I have independently reviewed following labs and imaging studies   CBC:  Recent Labs Lab 04/20/17 2135 04/21/17 0149 04/22/17 0519  WBC 22.6* 18.3* 14.0*  NEUTROABS 19.5* 16.3*  --   HGB 9.8* 8.1* 8.3*  HCT 29.8* 25.1* 26.3*  MCV 102.4* 100.4* 101.2*  PLT 328 235 207   Basic Metabolic Panel:  Recent Labs Lab 04/20/17 2135 04/21/17 0149 04/22/17 0519  NA 136 138 137  K 4.0 3.9 4.0  CL 100* 105 104  CO2 18* 23 25  GLUCOSE 218* 158* 143*  BUN 25* 21* 25*  CREATININE 1.32* 1.38* 1.46*  CALCIUM 9.1 8.2* 8.9   GFR: Estimated Creatinine Clearance: 37.8 mL/min (A) (by C-G formula based on SCr of 1.46 mg/dL (H)). Liver Function Tests:  Recent Labs Lab 04/21/17 0149 04/22/17 0519  AST 38 46*  ALT 23 36  ALKPHOS 65 63  BILITOT 1.2 1.5*  PROT 6.5 6.9  ALBUMIN 3.1* 3.2*    Recent Labs Lab 04/21/17 0149  LIPASE 72*   No results for input(s): AMMONIA in the last 168 hours. Coagulation Profile:  Recent Labs Lab 04/20/17 2135 04/21/17 0723 04/22/17 0519  INR  6.64* 7.71* 2.46   Cardiac Enzymes:  Recent Labs Lab 04/20/17 2135 04/21/17 0149 04/21/17 0723 04/21/17 1415  TROPONINI 0.05* 0.29* 0.76* 0.68*   Urine analysis:    Component Value Date/Time   COLORURINE YELLOW 04/21/2017 0403   APPEARANCEUR HAZY (A) 04/21/2017 0403   LABSPEC 1.029 04/21/2017 0403   PHURINE 5.0 04/21/2017 0403   GLUCOSEU NEGATIVE 04/21/2017 0403   HGBUR SMALL (A) 04/21/2017 0403   BILIRUBINUR NEGATIVE 04/21/2017 0403   KETONESUR NEGATIVE 04/21/2017 0403   PROTEINUR 100 (A) 04/21/2017 0403   UROBILINOGEN 0.2 12/06/2012 1540   NITRITE NEGATIVE 04/21/2017 0403   LEUKOCYTESUR NEGATIVE 04/21/2017 0403   Sepsis Labs:  Invalid input(s): PROCALCITONIN, LACTICIDVEN  Recent Results (from the past 240 hour(s))  MRSA PCR Screening     Status: None   Collection Time: 04/21/17  1:16 AM  Result Value Ref Range Status   MRSA by PCR NEGATIVE NEGATIVE Final    Comment:        The GeneXpert MRSA Assay (FDA approved for NASAL specimens only), is one component of a comprehensive MRSA colonization surveillance program. It is not intended to diagnose MRSA infection nor to guide or monitor treatment for MRSA infections.   Respiratory Panel by PCR     Status: None   Collection Time: 04/21/17  1:16 AM  Result Value Ref Range Status   Adenovirus NOT DETECTED NOT DETECTED Final   Coronavirus 229E NOT DETECTED NOT DETECTED Final   Coronavirus HKU1 NOT DETECTED NOT DETECTED Final   Coronavirus NL63 NOT DETECTED NOT DETECTED Final   Coronavirus OC43 NOT DETECTED NOT DETECTED Final   Metapneumovirus NOT DETECTED NOT DETECTED Final   Rhinovirus / Enterovirus NOT DETECTED NOT DETECTED Final   Influenza A NOT DETECTED NOT DETECTED Final   Influenza B NOT DETECTED NOT DETECTED Final   Parainfluenza Virus 1 NOT DETECTED NOT DETECTED Final   Parainfluenza Virus 2 NOT DETECTED NOT DETECTED Final   Parainfluenza Virus 3 NOT DETECTED NOT DETECTED Final   Parainfluenza Virus 4 NOT  DETECTED NOT DETECTED Final   Respiratory Syncytial Virus NOT DETECTED NOT DETECTED Final   Bordetella pertussis NOT DETECTED NOT DETECTED Final   Chlamydophila pneumoniae NOT DETECTED NOT DETECTED Final   Mycoplasma pneumoniae NOT DETECTED NOT DETECTED Final     Radiology Studies: Ct Abdomen Pelvis Wo Contrast  Result Date: 04/22/2017 CLINICAL DATA:  83 year old male with epigastric abdominal pain, nausea vomiting. EXAM: CT ABDOMEN AND PELVIS WITHOUT CONTRAST TECHNIQUE: Multidetector CT imaging of the abdomen and pelvis was performed following the standard protocol without IV contrast. COMPARISON:  None. FINDINGS: Evaluation of this exam is limited in the absence of intravenous contrast. Lower chest: There small bilateral pleural effusions. There are patchy areas of ground-glass and nodular density primarily involving the lung bases most consistent with pneumonia. Correlation with clinical exam recommended. There is mild cardiomegaly. Aortic valve replacement. Multi vessel coronary vascular disease. There is hypoattenuation of the cardiac blood pool suggestive of a degree of anemia. Clinical correlation is recommended. There is no intra-abdominal free air. Small free fluid within the pelvis. Hepatobiliary: The liver is unremarkable. Small scattered calcified liver granuloma. There is a 17 x 11 mm stone in the neck of the gallbladder. There is diffuse thickened and inflamed appearance of the gallbladder wall concerning for acute versus chronic cholecystitis. Ultrasound is recommended for further evaluation of the gallbladder. Pancreas: Unremarkable. No pancreatic ductal dilatation or surrounding inflammatory changes. Spleen: Normal in size without focal abnormality. Adrenals/Urinary Tract: The adrenal glands are unremarkable. Indeterminate an ill-defined 15 mm left renal upper pole hypodense lesion is not well characterized but may represent a cyst. There is no hydronephrosis or nephrolithiasis on either  side. The visualized ureters appear unremarkable. There is diffuse thickened appearance of the bladder wall likely related to chronic bladder outlet obstruction. Correlation with urinalysis recommended to exclude superimposed UTI. Stomach/Bowel: Small hiatal hernia. There is extensive sigmoid diverticulosis with muscular hypertrophy. No active inflammatory changes. There is moderate stool throughout the colon. No evidence of bowel obstruction or active inflammation. Normal appendix. Vascular/Lymphatic: There is advanced aortoiliac atherosclerotic disease. The aorta is mildly tortuous and ectatic measuring up to 2.8 cm.  Evaluation of the vasculature is limited in the absence of intravenous contrast. No portal venous gas identified. There is no adenopathy. Reproductive: The prostate and seminal vesicles are grossly unremarkable. Other: There is diffuse subcutaneous soft tissue edema and anasarca. Musculoskeletal: Osteopenia with degenerative changes of the spine. L3 superior endplate old-appearing mild compression deformity. L2-L3 and L4-L5 disc desiccation with vacuum phenomena. No acute fracture. Median sternotomy wires noted. IMPRESSION: 1. Small bilateral pleural effusions with patchy bilateral airspace densities most consistent with pneumonia. Clinical correlation recommended. 2. Cholelithiasis with thickened appearance of the gallbladder wall. Further evaluation with ultrasound is recommended. 3. No hydronephrosis or nephrolithiasis. Correlation with urinalysis recommended to exclude UTI. 4. Colonic diverticulosis. No bowel obstruction or active inflammation. Normal appendix. 5. Cardiomegaly with evidence of anemia. 6. Aortic Atherosclerosis (ICD10-I70.0). A 2.8 cm abdominal aortic ectasia. Electronically Signed   By: Elgie Collard M.D.   On: 04/22/2017 03:49   Dg Chest Portable 1 View  Result Date: 04/20/2017 CLINICAL DATA:  Acute onset of respiratory difficulty.  Trampoline. EXAM: PORTABLE CHEST 1 VIEW  COMPARISON:  The heart size is normal. Atherosclerotic calcifications are present at the aortic arch. FINDINGS: Heart is enlarged. Aortic atherosclerosis is present. Right lower lobe airspace disease is superimposed on chronic interstitial coarsening. Mild edema is present as well. Chronic right pleural thickening is again noted. IMPRESSION: 1. Right lower lobe airspace disease concerning for lobar pneumonia. 2. Chronic right pleural thickening. 3. Cardiac enlargement and mild edema. Electronically Signed   By: Marin Roberts M.D.   On: 04/20/2017 21:52   Scheduled Meds: . cholecalciferol  1,000 Units Oral QHS  . donepezil  10 mg Oral QHS  . escitalopram  10 mg Oral Daily  . folic acid  0.5 mg Oral Daily  . gabapentin  300 mg Oral QHS  . magnesium oxide  200 mg Oral Daily  . multivitamin with minerals  1 tablet Oral Daily  . pravastatin  40 mg Oral QPM  . sulfaSALAzine  500 mg Oral BID  . tamsulosin  0.4 mg Oral QPC supper   Continuous Infusions: . azithromycin Stopped (04/22/17 0347)  . piperacillin-tazobactam (ZOSYN)  IV 3.375 g (04/22/17 1102)  . sodium chloride Stopped (04/21/17 0147)    Pamella Pert, MD, PhD [facility] Pager [phone]  If 7PM-7AM, please contact night-coverage [email or web address] Password [email or web address] 04/22/2017, 11:50 AM

## 2017-04-23 DIAGNOSIS — I5032 Chronic diastolic (congestive) heart failure: Secondary | ICD-10-CM

## 2017-04-23 DIAGNOSIS — J181 Lobar pneumonia, unspecified organism: Secondary | ICD-10-CM

## 2017-04-23 DIAGNOSIS — K5190 Ulcerative colitis, unspecified, without complications: Secondary | ICD-10-CM

## 2017-04-23 DIAGNOSIS — J9621 Acute and chronic respiratory failure with hypoxia: Secondary | ICD-10-CM

## 2017-04-23 DIAGNOSIS — J690 Pneumonitis due to inhalation of food and vomit: Secondary | ICD-10-CM

## 2017-04-23 DIAGNOSIS — J189 Pneumonia, unspecified organism: Secondary | ICD-10-CM

## 2017-04-23 DIAGNOSIS — N183 Chronic kidney disease, stage 3 (moderate): Secondary | ICD-10-CM

## 2017-04-23 DIAGNOSIS — I10 Essential (primary) hypertension: Secondary | ICD-10-CM

## 2017-04-23 DIAGNOSIS — R110 Nausea: Secondary | ICD-10-CM

## 2017-04-23 DIAGNOSIS — Z952 Presence of prosthetic heart valve: Secondary | ICD-10-CM

## 2017-04-23 DIAGNOSIS — I482 Chronic atrial fibrillation: Secondary | ICD-10-CM

## 2017-04-23 LAB — BASIC METABOLIC PANEL
ANION GAP: 6 (ref 5–15)
BUN: 26 mg/dL — ABNORMAL HIGH (ref 6–20)
CHLORIDE: 106 mmol/L (ref 101–111)
CO2: 26 mmol/L (ref 22–32)
Calcium: 8.7 mg/dL — ABNORMAL LOW (ref 8.9–10.3)
Creatinine, Ser: 1.36 mg/dL — ABNORMAL HIGH (ref 0.61–1.24)
GFR calc non Af Amer: 45 mL/min — ABNORMAL LOW (ref >60)
GFR, EST AFRICAN AMERICAN: 52 mL/min — AB (ref >60)
Glucose, Bld: 141 mg/dL — ABNORMAL HIGH (ref 65–99)
POTASSIUM: 3.9 mmol/L (ref 3.5–5.1)
SODIUM: 138 mmol/L (ref 135–145)

## 2017-04-23 LAB — CBC
HCT: 24.3 % — ABNORMAL LOW (ref 39.0–52.0)
HEMOGLOBIN: 7.7 g/dL — AB (ref 13.0–17.0)
MCH: 32.1 pg (ref 26.0–34.0)
MCHC: 31.7 g/dL (ref 30.0–36.0)
MCV: 101.3 fL — AB (ref 78.0–100.0)
Platelets: 189 10*3/uL (ref 150–400)
RBC: 2.4 MIL/uL — AB (ref 4.22–5.81)
RDW: 15.1 % (ref 11.5–15.5)
WBC: 11.4 10*3/uL — AB (ref 4.0–10.5)

## 2017-04-23 LAB — PROTIME-INR
INR: 2.06
Prothrombin Time: 23.5 seconds — ABNORMAL HIGH (ref 11.4–15.2)

## 2017-04-23 NOTE — Consult Note (Signed)
[facility] Surgery Consult/Admission Note  Caleb Delgado [birth year]  [id number].    Requesting MD: Dr. Karleen Hampshire Chief Complaint/Reason for Consult: symptomatic cholelithiasis  HPI:   83 year old male with hypertension, hyperlipidemia, depression, UC, CKD, coronary artery disease with history of CABG, diastolic CHF, dementia, stroke, nonverbal status, A. fib on Coumadin who was admitted on 7/16 with hypoxic respiratory failure in the setting of probable aspiration pneumonia. Daughter at bedside and states pt had roughly 4 episodes of vomiting prior to arrival to the hospital. She states he was also having rigors. Pt did tell admitting MD that his abdomen hurt. Pt had breakfast yesterday and food today without complaints of abdominal pain or vomiting. US showed Cholelithiasis with thickened appearance of the gallbladder concerning for cholecystitis. Pt's WBC has been trending down and has been afebrile.   ROS:  Review of Systems  Unable to perform ROS: Dementia     Family History  Problem Relation Age of Onset  . Stroke Mother   . Diabetes Mother   . Stroke Father     Past Medical History:  Diagnosis Date  . Atrial fibrillation ([place])   . BPH (benign prostatic hyperplasia)   . CAD (coronary artery disease)   . Expressive aphasia   . HTN (hypertension)   . Other and unspecified hyperlipidemia   . S/P AVR (aortic valve replacement)   . Stroke [facility])    TIA  . Ulcerative colitis ([place])   . Unspecified sinusitis (chronic)     Past Surgical History:  Procedure Laterality Date  . AORTIC VALVE REPAIR    . CARDIAC SURGERY    . LUNG SURGERY     Thoracotomy for polyps  . TEE WITHOUT CARDIOVERSION N/A 12/08/2012   Procedure: TRANSESOPHAGEAL ECHOCARDIOGRAM (TEE);  Surgeon: Lelon Perla, MD;  Location: [facility] ENDOSCOPY;  Service: Cardiovascular;  Laterality: N/A;    Social History:  reports that he quit smoking about 46 years ago. He has never used smokeless tobacco. He  reports that he drinks alcohol. He reports that he does not use drugs.  Allergies:  Allergies  Allergen Reactions  . Nebivolol Anaphylaxis  . Tramadol Nausea Only    Medications Prior to Admission  Medication Sig Dispense Refill  . amLODipine (NORVASC) 10 MG tablet Take 1 tablet (10 mg total) by mouth daily. 30 tablet 11  . Cholecalciferol 1000 UNITS TBDP Take 1,000 Units by mouth at bedtime.    . Coenzyme Q10 (CO Q 10 PO) Take 1 capsule by mouth every evening.     . donepezil (ARICEPT) 10 MG tablet Take 10 mg by mouth at bedtime.    Marland Kitchen escitalopram (LEXAPRO) 10 MG tablet Take 10 mg by mouth daily.     . folic acid (FOLVITE) 0.5 MG tablet Take 0.5 mg by mouth daily.    . furosemide (LASIX) 20 MG tablet Take 1 tablet (20 mg total) by mouth daily. 90 tablet 3  . gabapentin (NEURONTIN) 600 MG tablet Take 600 mg by mouth at bedtime.     . Magnesium 250 MG TABS Take 250 mg by mouth daily.    . Multiple Vitamin (MULTIVITAMIN) tablet Take 1 tablet by mouth daily.    . potassium chloride (K-DUR) 10 MEQ tablet Take 1 tablet (10 mEq total) by mouth once. (Patient taking differently: Take 10 mEq by mouth daily. ) 90 tablet 3  . sulfaSALAzine (AZULFIDINE) 500 MG tablet Take 500 mg by mouth 2 (two) times daily.     . tamsulosin (FLOMAX) 0.4 MG  CAPS Take 0.4 mg by mouth daily after supper.    . traZODone (DESYREL) 50 MG tablet Take 50 mg by mouth at bedtime.    Marland Kitchen warfarin (COUMADIN) 5 MG tablet TAKE AS DIRECTED (Patient taking differently: Take 2.5-5 mg by mouth See admin instructions. Take 2.5 mg by mouth daily on Monday, Wednesday, Friday, Saturday and Sunday. Take 5 mg by mouth daily on all other days) 30 tablet 12  . albuterol (PROVENTIL HFA;VENTOLIN HFA) 108 (90 BASE) MCG/ACT inhaler Inhale 1-2 puffs into the lungs every 6 (six) hours as needed for wheezing or shortness of breath.      Blood pressure (!) 134/44, pulse 75, temperature 98.5 F (36.9 C), temperature source Oral, resp. rate 19,  height 5' 11.5" (1.816 m), weight 170 lb 10.2 oz (77.4 kg), SpO2 96 %.  Physical Exam  Constitutional: No distress.  Thin, white, elderly male, appears comfortable  HENT:  Head: Normocephalic and atraumatic.  Right Ear: External ear normal.  Left Ear: External ear normal.  Nose: Nose normal.  Eyes: Conjunctivae are normal. Right eye exhibits no discharge. Left eye exhibits no discharge. No scleral icterus.  Pupils are equal  Neck: Normal range of motion. Neck supple. No tracheal deviation present.  Cardiovascular: Normal rate, regular rhythm, S1 normal and S2 normal.   Murmur heard.  Systolic murmur is present  Pulses:      Radial pulses are 2+ on the right side, and 2+ on the left side.  Pulmonary/Chest: Effort normal and breath sounds normal. No respiratory distress. He has no wheezes. He has no rhonchi. He has no rales.  CTA anteriorly  Abdominal: Soft. Bowel sounds are normal. He exhibits no distension. No hernia.  Pt said yes to tenderness with palpation but was unable to states where the pain was  Musculoskeletal: Normal range of motion. He exhibits no edema or deformity.  To BUE  Neurological: He is alert.  Pt non-verbal  Skin: Skin is warm and dry. He is not diaphoretic.  Psychiatric:  Unable to assess  Nursing note and vitals reviewed.   Results for orders placed or performed during the hospital encounter of 04/20/17 (from the past 48 hour(s))  Troponin I (q 6hr x 3)     Status: Abnormal   Collection Time: 04/21/17  2:15 PM  Result Value Ref Range   Troponin I 0.68 (HH) <0.03 ng/mL    Comment: CRITICAL VALUE NOTED.  VALUE IS CONSISTENT WITH PREVIOUSLY REPORTED AND CALLED VALUE.  Protime-INR     Status: Abnormal   Collection Time: 04/22/17  5:19 AM  Result Value Ref Range   Prothrombin Time 27.1 (H) 11.4 - 15.2 seconds   INR 2.46   Comprehensive metabolic panel     Status: Abnormal   Collection Time: 04/22/17  5:19 AM  Result Value Ref Range   Sodium 137 135 -  145 mmol/L   Potassium 4.0 3.5 - 5.1 mmol/L   Chloride 104 101 - 111 mmol/L   CO2 25 22 - 32 mmol/L   Glucose, Bld 143 (H) 65 - 99 mg/dL   BUN 25 (H) 6 - 20 mg/dL   Creatinine, Ser 1.46 (H) 0.61 - 1.24 mg/dL   Calcium 8.9 8.9 - 10.3 mg/dL   Total Protein 6.9 6.5 - 8.1 g/dL   Albumin 3.2 (L) 3.5 - 5.0 g/dL   AST 46 (H) 15 - 41 U/L   ALT 36 17 - 63 U/L   Alkaline Phosphatase 63 38 - 126 U/L  Total Bilirubin 1.5 (H) 0.3 - 1.2 mg/dL   GFR calc non Af Amer 41 (L) >60 mL/min   GFR calc Af Amer 48 (L) >60 mL/min    Comment: (NOTE) The eGFR has been calculated using the CKD EPI equation. This calculation has not been validated in all clinical situations. eGFR's persistently <60 mL/min signify possible Chronic Kidney Disease.    Anion gap 8 5 - 15  CBC     Status: Abnormal   Collection Time: 04/22/17  5:19 AM  Result Value Ref Range   WBC 14.0 (H) 4.0 - 10.5 K/uL   RBC 2.60 (L) 4.22 - 5.81 MIL/uL   Hemoglobin 8.3 (L) 13.0 - 17.0 g/dL   HCT 26.3 (L) 39.0 - 52.0 %   MCV 101.2 (H) 78.0 - 100.0 fL   MCH 31.9 26.0 - 34.0 pg   MCHC 31.6 30.0 - 36.0 g/dL   RDW 15.2 11.5 - 15.5 %   Platelets 207 150 - 400 K/uL  Protime-INR     Status: Abnormal   Collection Time: 04/23/17  4:55 AM  Result Value Ref Range   Prothrombin Time 23.5 (H) 11.4 - 15.2 seconds   INR 2.03   Basic metabolic panel     Status: Abnormal   Collection Time: 04/23/17  4:55 AM  Result Value Ref Range   Sodium 138 135 - 145 mmol/L   Potassium 3.9 3.5 - 5.1 mmol/L   Chloride 106 101 - 111 mmol/L   CO2 26 22 - 32 mmol/L   Glucose, Bld 141 (H) 65 - 99 mg/dL   BUN 26 (H) 6 - 20 mg/dL   Creatinine, Ser 1.36 (H) 0.61 - 1.24 mg/dL   Calcium 8.7 (L) 8.9 - 10.3 mg/dL   GFR calc non Af Amer 45 (L) >60 mL/min   GFR calc Af Amer 52 (L) >60 mL/min    Comment: (NOTE) The eGFR has been calculated using the CKD EPI equation. This calculation has not been validated in all clinical situations. eGFR's persistently <60 mL/min  signify possible Chronic Kidney Disease.    Anion gap 6 5 - 15  CBC     Status: Abnormal   Collection Time: 04/23/17  4:55 AM  Result Value Ref Range   WBC 11.4 (H) 4.0 - 10.5 K/uL   RBC 2.40 (L) 4.22 - 5.81 MIL/uL   Hemoglobin 7.7 (L) 13.0 - 17.0 g/dL   HCT 24.3 (L) 39.0 - 52.0 %   MCV 101.3 (H) 78.0 - 100.0 fL   MCH 32.1 26.0 - 34.0 pg   MCHC 31.7 30.0 - 36.0 g/dL   RDW 15.1 11.5 - 15.5 %   Platelets 189 150 - 400 K/uL   Ct Abdomen Pelvis Wo Contrast  Result Date: 04/22/2017 CLINICAL DATA:  83 year old male with epigastric abdominal pain, nausea vomiting. EXAM: CT ABDOMEN AND PELVIS WITHOUT CONTRAST TECHNIQUE: Multidetector CT imaging of the abdomen and pelvis was performed following the standard protocol without IV contrast. COMPARISON:  None. FINDINGS: Evaluation of this exam is limited in the absence of intravenous contrast. Lower chest: There small bilateral pleural effusions. There are patchy areas of ground-glass and nodular density primarily involving the lung bases most consistent with pneumonia. Correlation with clinical exam recommended. There is mild cardiomegaly. Aortic valve replacement. Multi vessel coronary vascular disease. There is hypoattenuation of the cardiac blood pool suggestive of a degree of anemia. Clinical correlation is recommended. There is no intra-abdominal free air. Small free fluid within the pelvis. Hepatobiliary: The liver  is unremarkable. Small scattered calcified liver granuloma. There is a 17 x 11 mm stone in the neck of the gallbladder. There is diffuse thickened and inflamed appearance of the gallbladder wall concerning for acute versus chronic cholecystitis. Ultrasound is recommended for further evaluation of the gallbladder. Pancreas: Unremarkable. No pancreatic ductal dilatation or surrounding inflammatory changes. Spleen: Normal in size without focal abnormality. Adrenals/Urinary Tract: The adrenal glands are unremarkable. Indeterminate an ill-defined  15 mm left renal upper pole hypodense lesion is not well characterized but may represent a cyst. There is no hydronephrosis or nephrolithiasis on either side. The visualized ureters appear unremarkable. There is diffuse thickened appearance of the bladder wall likely related to chronic bladder outlet obstruction. Correlation with urinalysis recommended to exclude superimposed UTI. Stomach/Bowel: Small hiatal hernia. There is extensive sigmoid diverticulosis with muscular hypertrophy. No active inflammatory changes. There is moderate stool throughout the colon. No evidence of bowel obstruction or active inflammation. Normal appendix. Vascular/Lymphatic: There is advanced aortoiliac atherosclerotic disease. The aorta is mildly tortuous and ectatic measuring up to 2.8 cm. Evaluation of the vasculature is limited in the absence of intravenous contrast. No portal venous gas identified. There is no adenopathy. Reproductive: The prostate and seminal vesicles are grossly unremarkable. Other: There is diffuse subcutaneous soft tissue edema and anasarca. Musculoskeletal: Osteopenia with degenerative changes of the spine. L3 superior endplate old-appearing mild compression deformity. L2-L3 and L4-L5 disc desiccation with vacuum phenomena. No acute fracture. Median sternotomy wires noted. IMPRESSION: 1. Small bilateral pleural effusions with patchy bilateral airspace densities most consistent with pneumonia. Clinical correlation recommended. 2. Cholelithiasis with thickened appearance of the gallbladder wall. Further evaluation with ultrasound is recommended. 3. No hydronephrosis or nephrolithiasis. Correlation with urinalysis recommended to exclude UTI. 4. Colonic diverticulosis. No bowel obstruction or active inflammation. Normal appendix. 5. Cardiomegaly with evidence of anemia. 6. Aortic Atherosclerosis (ICD10-I70.0). A 2.8 cm abdominal aortic ectasia. Electronically Signed   By: Anner Crete M.D.   On: 04/22/2017 03:49    US Abdomen Limited Ruq  Result Date: 04/23/2017 CLINICAL DATA:  83 year old male with right upper quadrant abdominal pain and nausea. EXAM: ULTRASOUND ABDOMEN LIMITED RIGHT UPPER QUADRANT COMPARISON:  Abdominal CT dated 04/21/2017 FINDINGS: Gallbladder: There is sludge and multiple stones within the gallbladder. The gallbladder wall appears edematous and measures approximately 11 mm in thickness. A small pericholecystic fluid may be present. Negative sonographic Murphy's sign. Common bile duct: Diameter: 5 mm Liver: The liver appears unremarkable as visualized. The main portal vein is patent with hepatopetal flow. IMPRESSION: Cholelithiasis with thickened appearance of the gallbladder concerning for cholecystitis, acute versus chronic. Correlation with clinical exam recommended. A hepatobiliary scintigraphy may provide better evaluation of the gallbladder if there is a high clinical concern for acute cholecystitis . Electronically Signed   By: Anner Crete M.D.   On: 04/23/2017 03:39      Assessment/Plan  Principal Problem:   Acute on chronic respiratory failure with hypoxia (HCC) Active Problems:   Expressive aphasia   HTN (hypertension)   S/P AVR (aortic valve replacement)   Atrial fibrillation (HCC)   CAD (coronary artery disease)   Chronic diastolic CHF (congestive heart failure) (HCC)   CKD (chronic kidney disease), stage III   Sepsis ([place])   CAP (community acquired pneumonia)   Ulcerative colitis ([place])   Stroke ([place])   Abdominal pain   Aspiration pneumonia ([place])   Possible chronic vs acute cholecystitis  - pt is currently able to eat and does not appear to have symptoms -  will see pt again tomorrow. If he does not have vomiting nor complains of abdominal pain will continue to watch If pt experiences pain or vomiting then would recommend possible a HIDA scan and if + then would recommend a perc tube over surgery due to pt's comorbidities.   We will continue to follow.  Thank you for the consult.   Kalman Drape, [facility] Surgery 04/23/2017, 2:09 PM Pager: [phone] Consults: [phone] Mon-Fri 7:00 am-4:30 pm Sat-Sun 7:00 am-11:30 am

## 2017-04-23 NOTE — Progress Notes (Signed)
ANTICOAGULATION CONSULT NOTE - Follow Up Consult  Pharmacy Consult for Heparin (when INR<2) Indication: Afib  Patient Measurements: Height: 5' 11.5" (181.6 cm) Weight: 170 lb 10.2 oz (77.4 kg) IBW/kg (Calculated) : 76.45 Heparin Dosing Weight: 77 kg  Vital Signs: Temp: 98.5 F (36.9 C) (07/18 0759) Temp Source: Oral (07/18 0759) BP: 134/44 (07/18 0759) Pulse Rate: 75 (07/18 0759)  Labs:  Recent Labs  04/21/17 0149 04/21/17 0723 04/21/17 1415 04/22/17 0519 04/23/17 0455  HGB 8.1*  --   --  8.3* 7.7*  HCT 25.1*  --   --  26.3* 24.3*  PLT 235  --   --  207 189  LABPROT  --  67.6*  --  27.1* 23.5*  INR  --  7.71*  --  2.46 2.06  CREATININE 1.38*  --   --  1.46* 1.36*  TROPONINI 0.29* 0.76* 0.68*  --   --     Estimated Creatinine Clearance: 40.6 mL/min (A) (by C-G formula based on SCr of 1.36 mg/dL (H)).   Assessment: 89 YOM on warfarin PTA for hx Afib (also with hx tissue AVR) who presented with abdominal pain. INR elevated at 6.64 on admission. Vit K given on 7/16. Warfarin will continue to be held due to imaging showing cholelithiasis for possible surgery/intervention plans. Pharmacy consulted to start Heparin when INR <2.   INR today remains therapeutic at 2.06. Will hold heparin initiation and f/u INR with AM labs.   Goal of Therapy:  Heparin level 0.3-0.7 units/ml Monitor platelets by anticoagulation protocol: Yes   Plan:  1. Warfarin held 2. No heparin needed today due to INR>2 3. Will f/u daily PT/INR and plan to initiate heparin drip when INR <2  Thank you for allowing pharmacy to be a part of this patient's care.  Georgina PillionElizabeth Mariona Scholes, PharmD, BCPS Clinical Pharmacist Pager: [phone] Clinical phone for 04/23/2017 from 7a-3:30p: [phone] If after 3:30p, please call main pharmacy at: [phone] 04/23/2017 9:54 AM

## 2017-04-23 NOTE — Progress Notes (Signed)
  Speech Language Pathology Treatment: Dysphagia  Patient Details Name: Caleb Delgado MRN: [id number] DOB: 05/17/1926 Today's Date: 04/23/2017 Time: 0943-1000 SLP Time Calculation (min) (ACUTE ONLY): 17 min  Assessment / Plan / Recommendation Clinical Impression  Pt seen with am meal. Pt alert and attentive to speaker, able to follow simple visual/gestural commands. No signs of aspiration with honey thick liquids, but severely prolonged oral phase with mechanical soft solids. Even with max visual cues to swallow and liquid washes pt did not form bolus and swallow food. Pt also did not consistently initiate self feeding. Recommend downgrade to dys 1 puree with honey thick liquids to determine if pts nutritional intake could be more efficient. No attempts made today to train pt in throat clearing given difficulty with solids.   HPI HPI: Caleb FabianFred Carlton Hughesis a 83 y.o.malewith medical history significant of hypertension, hyperlipidemia, depression, ulcerative colitis, s/p of AVR (biovalve), CAD, s/p of CABG, dCHF, dementia, stroke, nonverbal status, BPH, atrial fibrillation on Coumadin, who presents with shortness of breath, cough, nausea, vomiting and abdominal pain. He vomited 4 times without blood in the vomitus. Chest x-ray showed left lower lobe infiltration, indicating CAP. Patient has nausea, vomiting, indicating possible aspiration pneumonia too. Patient has a severe sepsis with lactic acid 8.38, leukocytosis, tachycardia and tachypnea. Pt is also fluid overloaded.       SLP Plan  Continue with current plan of care       Recommendations  Diet recommendations: Dysphagia 1 (puree);Honey-thick liquid Liquids provided via: Cup Medication Administration: Crushed with puree Supervision: Staff to assist with self feeding;Intermittent supervision to cue for compensatory strategies Compensations: Slow rate;Small sips/bites;Clear throat intermittently;Hard cough after swallow                 Follow up Recommendations: 24 hour supervision/assistance SLP Visit Diagnosis: Dysphagia, oropharyngeal phase (R13.12) Plan: Continue with current plan of care       GO                Caleb Delgado, Riley NearingBonnie Caroline 04/23/2017, 11:51 AM

## 2017-04-23 NOTE — Plan of Care (Signed)
Problem: Safety: Goal: Ability to remain free from injury will improve Outcome: Progressing Discussed with patient the importance of drinking slower with some teach back displayed.

## 2017-04-23 NOTE — Progress Notes (Signed)
PROGRESS NOTE    Caleb Delgado  RJJ:[id number] DOB: 1925/12/15 DOA: 04/20/2017 PCP: Loyal Jacobson, MD    Brief Narrative: 83 year old male with hypertension, hyperlipidemia, depression, UC, by of valve AVR, coronary artery disease with history of CABG, diastolic CHF, dementia, stroke, nonverbal status, A. fib on Coumadin who was admitted on 7/16 with hypoxic respiratory failure in the setting of probable aspiration pneumonia. Patient also had nausea, vomiting prior to admission. None of the symptoms present today.   Assessment & Plan:   Principal Problem:   Acute on chronic respiratory failure with hypoxia (HCC) Active Problems:   Expressive aphasia   HTN (hypertension)   S/P AVR (aortic valve replacement)   Atrial fibrillation (HCC)   CAD (coronary artery disease)   Chronic diastolic CHF (congestive heart failure) (HCC)   CKD (chronic kidney disease), stage III   Sepsis (HCC)   CAP (community acquired pneumonia)   Ulcerative colitis (HCC)   Stroke (HCC)   Abdominal pain   Aspiration pneumonia (HCC)   Acute on chronic respiratory failure with hypoxia secondary to aspiration pneumonia:  On zosyn and zithromax to complete the course.  SLP eval and recommendations given and he is on dysphagia 1 diet.  He is afebrile and leukocytosis is improving, does not appear toxic.  He is still on 2 lit of [place] oxygen to keep sats greater than 90%.    H/o UC;  No episodes of diarrhea.     Hyperlipidemia:  Resume pravachol.   Dementia:  No signs of agitation.  Resume aricept.   Hypertension: prn hydralazine.    H/o afib and s/p AVR:  His a fib rate well controlled.  On coumadin for anticoagulation at home. One dose of VIT K ordered for supra therapeutic INR earlier during admission. INR is therapeutic today. Hold coumadin and start IV heparin once INR is less than 2, for possible surgery.    Nausea, vomiting and abdominal pain:  Currently he is asymptomatic. US abdomen  shows gb thickening and gall stones. Murphy's sign is negative.  Surgery consulted to see if GB needs to come out vs HIDA scan.   Mild acute on chronic diastolic CHF:  Currently euvolemic.    Stage 3 CKD:  Creatinine at baseline.    CAD: Pt denies chest pain. Troponin elevation , possibly from demand ischemia from infection and possibly CHF.   H/o CVA:  Follows simple commands , non focal at this time.      DVT prophylaxis: (scd's) Code Status: DNR) Family Communication: discussed with daughter at bedside.  Disposition Plan: pending surgery consultation and PT evaluation.    Consultants:   Surgery  SLP  Procedures: CT abd and pelvis.  US abdomen.    Antimicrobials: zithromax and zosyn since admission.   Subjective: Appears comfortable.  Denies any new complaints.   Objective: Vitals:   04/23/17 0400 04/23/17 0500 04/23/17 0600 04/23/17 0759  BP: (!) 123/49 (!) 115/45 (!) 121/43 (!) 134/44  Pulse: 72 67 69 75  Resp: (!) 23 (!) 22 (!) 22 19  Temp:    98.5 F (36.9 C)  TempSrc:    Oral  SpO2: 96% 98% 96%   Weight:      Height:        Intake/Output Summary (Last 24 hours) at 04/23/17 1039 Last data filed at 04/23/17 0600  Gross per 24 hour  Intake              925 ml  Output  775 ml  Net              150 ml   Filed Weights   04/21/17 0109  Weight: 77.4 kg (170 lb 10.2 oz)    Examination:  General exam: Appears calm and comfortable  Respiratory system: Clear to auscultation. Respiratory effort normal. Cardiovascular system: S1 & S2 heard, RRR.No pedal edema. Gastrointestinal system: Abdomen is nondistended, soft and nontender. No organomegaly or masses felt. Normal bowel sounds heard. Central nervous system: Alert and not oriented. Able to move all extremities.  Extremities: Symmetric 5 x 5 power. Skin: No rashes, lesions or ulcers Psychiatry: calm and comfortable and pleasant.    Data Reviewed: I have personally reviewed  following labs and imaging studies  CBC:  Recent Labs Lab 04/20/17 2135 04/21/17 0149 04/22/17 0519 04/23/17 0455  WBC 22.6* 18.3* 14.0* 11.4*  NEUTROABS 19.5* 16.3*  --   --   HGB 9.8* 8.1* 8.3* 7.7*  HCT 29.8* 25.1* 26.3* 24.3*  MCV 102.4* 100.4* 101.2* 101.3*  PLT 328 235 207 189   Basic Metabolic Panel:  Recent Labs Lab 04/20/17 2135 04/21/17 0149 04/22/17 0519 04/23/17 0455  NA 136 138 137 138  K 4.0 3.9 4.0 3.9  CL 100* 105 104 106  CO2 18* 23 25 26   GLUCOSE 218* 158* 143* 141*  BUN 25* 21* 25* 26*  CREATININE 1.32* 1.38* 1.46* 1.36*  CALCIUM 9.1 8.2* 8.9 8.7*   GFR: Estimated Creatinine Clearance: 40.6 mL/min (A) (by C-G formula based on SCr of 1.36 mg/dL (H)). Liver Function Tests:  Recent Labs Lab 04/21/17 0149 04/22/17 0519  AST 38 46*  ALT 23 36  ALKPHOS 65 63  BILITOT 1.2 1.5*  PROT 6.5 6.9  ALBUMIN 3.1* 3.2*    Recent Labs Lab 04/21/17 0149  LIPASE 72*   No results for input(s): AMMONIA in the last 168 hours. Coagulation Profile:  Recent Labs Lab 04/20/17 2135 04/21/17 0723 04/22/17 0519 04/23/17 0455  INR 6.64* 7.71* 2.46 2.06   Cardiac Enzymes:  Recent Labs Lab 04/20/17 2135 04/21/17 0149 04/21/17 0723 04/21/17 1415  TROPONINI 0.05* 0.29* 0.76* 0.68*   BNP (last 3 results) No results for input(s): PROBNP in the last 8760 hours. HbA1C: No results for input(s): HGBA1C in the last 72 hours. CBG: No results for input(s): GLUCAP in the last 168 hours. Lipid Profile: No results for input(s): CHOL, HDL, LDLCALC, TRIG, CHOLHDL, LDLDIRECT in the last 72 hours. Thyroid Function Tests: No results for input(s): TSH, T4TOTAL, FREET4, T3FREE, THYROIDAB in the last 72 hours. Anemia Panel: No results for input(s): VITAMINB12, FOLATE, FERRITIN, TIBC, IRON, RETICCTPCT in the last 72 hours. Sepsis Labs:  Recent Labs Lab 04/20/17 2327 04/21/17 0149 04/21/17 0418 04/21/17 0723  PROCALCITON  --  0.52  --   --   LATICACIDVEN  TEST REQUEST RECEIVED WITHOUT APPROPRIATE SPECIMEN 2.6* 2.0* 2.1*    Recent Results (from the past 240 hour(s))  Blood culture (routine x 2)     Status: None (Preliminary result)   Collection Time: 04/20/17 10:00 PM  Result Value Ref Range Status   Specimen Description BLOOD LEFT ARM  Final   Special Requests   Final    BOTTLES DRAWN AEROBIC AND ANAEROBIC Blood Culture adequate volume   Culture   Final    NO GROWTH 1 DAY Performed at [facility], [address] [place], [place] [address]    Report Status PENDING  Incomplete  Blood culture (routine x 2)  Status: None (Preliminary result)   Collection Time: 04/20/17 10:00 PM  Result Value Ref Range Status   Specimen Description BLOOD RIGHT ARM  Final   Special Requests   Final    BOTTLES DRAWN AEROBIC AND ANAEROBIC Blood Culture adequate volume   Culture   Final    NO GROWTH 1 DAY Performed at [facility], [address] [place], [place] [address]    Report Status PENDING  Incomplete  MRSA PCR Screening     Status: None   Collection Time: 04/21/17  1:16 AM  Result Value Ref Range Status   MRSA by PCR NEGATIVE NEGATIVE Final    Comment:        The GeneXpert MRSA Assay (FDA approved for NASAL specimens only), is one component of a comprehensive MRSA colonization surveillance program. It is not intended to diagnose MRSA infection nor to guide or monitor treatment for MRSA infections.   Respiratory Panel by PCR     Status: None   Collection Time: 04/21/17  1:16 AM  Result Value Ref Range Status   Adenovirus NOT DETECTED NOT DETECTED Final   Coronavirus 229E NOT DETECTED NOT DETECTED Final   Coronavirus HKU1 NOT DETECTED NOT DETECTED Final   Coronavirus NL63 NOT DETECTED NOT DETECTED Final   Coronavirus OC43 NOT DETECTED NOT DETECTED Final   Metapneumovirus NOT DETECTED NOT DETECTED Final   Rhinovirus / Enterovirus NOT DETECTED NOT DETECTED Final   Influenza A NOT DETECTED NOT DETECTED Final   Influenza  B NOT DETECTED NOT DETECTED Final   Parainfluenza Virus 1 NOT DETECTED NOT DETECTED Final   Parainfluenza Virus 2 NOT DETECTED NOT DETECTED Final   Parainfluenza Virus 3 NOT DETECTED NOT DETECTED Final   Parainfluenza Virus 4 NOT DETECTED NOT DETECTED Final   Respiratory Syncytial Virus NOT DETECTED NOT DETECTED Final   Bordetella pertussis NOT DETECTED NOT DETECTED Final   Chlamydophila pneumoniae NOT DETECTED NOT DETECTED Final   Mycoplasma pneumoniae NOT DETECTED NOT DETECTED Final         Radiology Studies: Ct Abdomen Pelvis Wo Contrast  Result Date: 04/22/2017 CLINICAL DATA:  83 year old male with epigastric abdominal pain, nausea vomiting. EXAM: CT ABDOMEN AND PELVIS WITHOUT CONTRAST TECHNIQUE: Multidetector CT imaging of the abdomen and pelvis was performed following the standard protocol without IV contrast. COMPARISON:  None. FINDINGS: Evaluation of this exam is limited in the absence of intravenous contrast. Lower chest: There small bilateral pleural effusions. There are patchy areas of ground-glass and nodular density primarily involving the lung bases most consistent with pneumonia. Correlation with clinical exam recommended. There is mild cardiomegaly. Aortic valve replacement. Multi vessel coronary vascular disease. There is hypoattenuation of the cardiac blood pool suggestive of a degree of anemia. Clinical correlation is recommended. There is no intra-abdominal free air. Small free fluid within the pelvis. Hepatobiliary: The liver is unremarkable. Small scattered calcified liver granuloma. There is a 17 x 11 mm stone in the neck of the gallbladder. There is diffuse thickened and inflamed appearance of the gallbladder wall concerning for acute versus chronic cholecystitis. Ultrasound is recommended for further evaluation of the gallbladder. Pancreas: Unremarkable. No pancreatic ductal dilatation or surrounding inflammatory changes. Spleen: Normal in size without focal abnormality.  Adrenals/Urinary Tract: The adrenal glands are unremarkable. Indeterminate an ill-defined 15 mm left renal upper pole hypodense lesion is not well characterized but may represent a cyst. There is no hydronephrosis or nephrolithiasis on either side. The visualized ureters appear unremarkable. There is diffuse thickened appearance  of the bladder wall likely related to chronic bladder outlet obstruction. Correlation with urinalysis recommended to exclude superimposed UTI. Stomach/Bowel: Small hiatal hernia. There is extensive sigmoid diverticulosis with muscular hypertrophy. No active inflammatory changes. There is moderate stool throughout the colon. No evidence of bowel obstruction or active inflammation. Normal appendix. Vascular/Lymphatic: There is advanced aortoiliac atherosclerotic disease. The aorta is mildly tortuous and ectatic measuring up to 2.8 cm. Evaluation of the vasculature is limited in the absence of intravenous contrast. No portal venous gas identified. There is no adenopathy. Reproductive: The prostate and seminal vesicles are grossly unremarkable. Other: There is diffuse subcutaneous soft tissue edema and anasarca. Musculoskeletal: Osteopenia with degenerative changes of the spine. L3 superior endplate old-appearing mild compression deformity. L2-L3 and L4-L5 disc desiccation with vacuum phenomena. No acute fracture. Median sternotomy wires noted. IMPRESSION: 1. Small bilateral pleural effusions with patchy bilateral airspace densities most consistent with pneumonia. Clinical correlation recommended. 2. Cholelithiasis with thickened appearance of the gallbladder wall. Further evaluation with ultrasound is recommended. 3. No hydronephrosis or nephrolithiasis. Correlation with urinalysis recommended to exclude UTI. 4. Colonic diverticulosis. No bowel obstruction or active inflammation. Normal appendix. 5. Cardiomegaly with evidence of anemia. 6. Aortic Atherosclerosis (ICD10-I70.0). A 2.8 cm  abdominal aortic ectasia. Electronically Signed   By: Elgie CollardArash  Radparvar M.D.   On: 04/22/2017 03:49   [place] Abdomen Limited Ruq  Result Date: 04/23/2017 CLINICAL DATA:  83 year old male with right upper quadrant abdominal pain and nausea. EXAM: ULTRASOUND ABDOMEN LIMITED RIGHT UPPER QUADRANT COMPARISON:  Abdominal CT dated 04/21/2017 FINDINGS: Gallbladder: There is sludge and multiple stones within the gallbladder. The gallbladder wall appears edematous and measures approximately 11 mm in thickness. A small pericholecystic fluid may be present. Negative sonographic Murphy's sign. Common bile duct: Diameter: 5 mm Liver: The liver appears unremarkable as visualized. The main portal vein is patent with hepatopetal flow. IMPRESSION: Cholelithiasis with thickened appearance of the gallbladder concerning for cholecystitis, acute versus chronic. Correlation with clinical exam recommended. A hepatobiliary scintigraphy may provide better evaluation of the gallbladder if there is a high clinical concern for acute cholecystitis . Electronically Signed   By: Elgie CollardArash  Radparvar M.D.   On: 04/23/2017 03:39        Scheduled Meds: . cholecalciferol  1,000 Units Oral QHS  . donepezil  10 mg Oral QHS  . escitalopram  10 mg Oral Daily  . folic acid  0.5 mg Oral Daily  . gabapentin  300 mg Oral QHS  . magnesium oxide  200 mg Oral Daily  . multivitamin with minerals  1 tablet Oral Daily  . pravastatin  40 mg Oral QPM  . sulfaSALAzine  500 mg Oral BID  . tamsulosin  0.4 mg Oral QPC supper   Continuous Infusions: . azithromycin Stopped (04/23/17 0433)  . piperacillin-tazobactam (ZOSYN)  IV Stopped (04/23/17 0824)  . sodium chloride Stopped (04/21/17 0147)     LOS: 3 days    Time spent: 35 minutes    Stacy Deshler, MD [facility] Pager [phone] If 7PM-7AM, please contact night-coverage [email or web address] Password [email or web address] 04/23/2017, 10:39 AM

## 2017-04-24 LAB — CBC
HEMATOCRIT: 28.7 % — AB (ref 39.0–52.0)
Hemoglobin: 9 g/dL — ABNORMAL LOW (ref 13.0–17.0)
MCH: 32 pg (ref 26.0–34.0)
MCHC: 31.4 g/dL (ref 30.0–36.0)
MCV: 102.1 fL — ABNORMAL HIGH (ref 78.0–100.0)
Platelets: 214 10*3/uL (ref 150–400)
RBC: 2.81 MIL/uL — ABNORMAL LOW (ref 4.22–5.81)
RDW: 14.8 % (ref 11.5–15.5)
WBC: 11 10*3/uL — ABNORMAL HIGH (ref 4.0–10.5)

## 2017-04-24 LAB — PROTIME-INR
INR: 2.08
PROTHROMBIN TIME: 23.7 s — AB (ref 11.4–15.2)

## 2017-04-24 MED ORDER — WARFARIN - PHARMACIST DOSING INPATIENT: Freq: Every day | Status: DC

## 2017-04-24 MED ORDER — WARFARIN SODIUM 5 MG PO TABS
2.5000 mg | ORAL_TABLET | Freq: Once | ORAL | Status: AC
  Administered 2017-04-24: 2.5 mg via ORAL
  Filled 2017-04-24: qty 1

## 2017-04-24 NOTE — Progress Notes (Signed)
S: no in room, no events overnight O: BP (!) 141/47   Pulse 80   Temp 98.5 F (36.9 C) (Oral)   Resp (!) 24   Ht 5' 11.5" (1.816 m)   Wt 77.4 kg (170 lb 10.2 oz)   SpO2 96%   BMI 23.47 kg/m  Gen: NAD Neuro: alert, aphasic Abd: soft, no guarding  A/P 83 yo male with multiple medical problems. WBC improved. Tolerated diet yesterday. -Given his WBC decrease, I continue to think that this is either not acute cholecystitis or the antibiotics being given for pneumonia are also effectively treating any gallbladder issue. I continue to recommend continuation of any diet the primary team deems appropriate and that he needs no further treatment of his gallstones.

## 2017-04-24 NOTE — Progress Notes (Addendum)
ANTICOAGULATION CONSULT NOTE - Follow Up Consult  Pharmacy Consult for Coumadin Indication: Afib  Patient Measurements: Height: 5' 11.5" (181.6 cm) Weight: 170 lb 10.2 oz (77.4 kg) IBW/kg (Calculated) : 76.45 Heparin Dosing Weight: 77 kg  Vital Signs: Temp: 98.6 F (37 C) (07/19 0802) Temp Source: Oral (07/19 0802) BP: 151/41 (07/19 0802) Pulse Rate: 71 (07/19 0802)  Labs:  Recent Labs  04/21/17 1415 04/22/17 0519 04/23/17 0455 04/24/17 0345  HGB  --  8.3* 7.7*  --   HCT  --  26.3* 24.3*  --   PLT  --  207 189  --   LABPROT  --  27.1* 23.5* 23.7*  INR  --  2.46 2.06 2.08  CREATININE  --  1.46* 1.36*  --   TROPONINI 0.68*  --   --   --     Estimated Creatinine Clearance: 40.6 mL/min (A) (by C-G formula based on SCr of 1.36 mg/dL (H)).   Assessment: 89 YOM on warfarin PTA for hx Afib (also with hx tissue AVR) who presented with abdominal pain. INR elevated at 6.64 on admission. Vit K given on 7/16. Warfarin will was held 7/16-7/18 due to possible surgery/intervention plans for cholelithiasis. No surgery is anticipated and pharmacy is now consulted to restart Coumadin.   INR today remains therapeutic at 2.08.   Goal of Therapy:  INR 2-3 Monitor platelets by anticoagulation protocol: Yes   Plan:  1. Coumadin 2.5mg  today 2. Daily PT/INR monitoring 3. Monitor for bleeding complications  Thank you for allowing pharmacy to be a part of this patient's care.  Sallee Provencalurner, Lynora Dymond S Clinical Pharmacist Clinical phone for 04/24/2017 from 7a-3:30p: [phone] If after 3:30p, please call main pharmacy at: [phone] 04/24/2017 8:25 AM

## 2017-04-24 NOTE — Evaluation (Addendum)
Physical Therapy Evaluation Patient Details Name: Caleb Delgado MRN: [id number] DOB: 07/15/1926 Today's Date: 04/24/2017   History of Present Illness  Pt adm with acute hypoxic respiratory failure due to PNA. Pt with ?cholecystitis but tolerating diet and decr WBC so no surgery intervention. PMH - dementia, chf, afib, cabg, avr, ckd, cva  Clinical Impression  Pt admitted with above diagnosis and presents to PT with functional limitations due to deficits listed below (See PT problem list). Pt needs skilled PT to maximize independence and safety to allow discharge to home with family. Pt has 24 hour care at home. Expect pt not far from baseline and expect he will quickly return to his baseline with mobility. Will follow acutely but doubt he will need PT after DC.     Follow Up Recommendations No PT follow up;Supervision/Assistance - 24 hour    Equipment Recommendations  None recommended by PT    Recommendations for Other Services       Precautions / Restrictions Precautions Precautions: Fall Restrictions Weight Bearing Restrictions: No      Mobility  Bed Mobility Overal bed mobility: Needs Assistance Bed Mobility: Supine to Sit     Supine to sit: Mod assist     General bed mobility comments: Assist to move legs off bed and elevate trunk into sitting  Transfers Overall transfer level: Needs assistance Equipment used: 4-wheeled walker Transfers: Sit to/from Stand Sit to Stand: Min assist         General transfer comment: Assist for balance and safety  Ambulation/Gait Ambulation/Gait assistance: Min guard Ambulation Distance (Feet): 170 Feet Assistive device: 4-wheeled walker Gait Pattern/deviations: Step-through pattern;Decreased stride length;Trunk flexed     General Gait Details: Assist for lines and safety. Began mobility on RA with SpO2 dropping to 87%. Replaced O2 at 3L with SpO2 >92% with remaining mobility.  Stairs            Wheelchair  Mobility    Modified Rankin (Stroke Patients Only)       Balance Overall balance assessment: Needs assistance Sitting-balance support: No upper extremity supported;Feet supported Sitting balance-Leahy Scale: Good     Standing balance support: Bilateral upper extremity supported Standing balance-Leahy Scale: Poor Standing balance comment: walker and supervision for static standing                             Pertinent Vitals/Pain Pain Assessment: Faces Faces Pain Scale: No hurt    Home Living Family/patient expects to be discharged to:: Private residence   Available Help at Discharge: Family;Personal care attendant;Available 24 hours/day           Home Equipment: Walker - 4 wheels Additional Comments: Lives in own home with hired caregivers during the day and family with him at night    Prior Function Level of Independence: Needs assistance   Gait / Transfers Assistance Needed: amb with rollator. Unsure if assisted.           Hand Dominance   Dominant Hand: Right    Extremity/Trunk Assessment   Upper Extremity Assessment Upper Extremity Assessment: Generalized weakness    Lower Extremity Assessment Lower Extremity Assessment: Generalized weakness       Communication   Communication: Expressive difficulties;Other (comment) (no verbal output with me)  Cognition Arousal/Alertness: Awake/alert Behavior During Therapy: WFL for tasks assessed/performed Overall Cognitive Status: No family/caregiver present to determine baseline cognitive functioning Area of Impairment: Following commands  Following Commands: Follows one step commands consistently              General Comments      Exercises     Assessment/Plan    PT Assessment Patient needs continued PT services  PT Problem List Decreased strength;Decreased activity tolerance;Decreased balance;Decreased mobility;Cardiopulmonary status limiting  activity       PT Treatment Interventions DME instruction;Gait training;Functional mobility training;Therapeutic activities;Therapeutic exercise;Balance training;Patient/family education    PT Goals (Current goals can be found in the Care Plan section)  Acute Rehab PT Goals Patient Stated Goal: Pt unable to state PT Goal Formulation: Patient unable to participate in goal setting Time For Goal Achievement: 05/01/17 Potential to Achieve Goals: Good    Frequency Min 3X/week   Barriers to discharge        Co-evaluation               AM-PAC PT "6 Clicks" Daily Activity  Outcome Measure Difficulty turning over in bed (including adjusting bedclothes, sheets and blankets)?: Total Difficulty moving from lying on back to sitting on the side of the bed? : Total Difficulty sitting down on and standing up from a chair with arms (e.g., wheelchair, bedside commode, etc,.)?: Total Help needed moving to and from a bed to chair (including a wheelchair)?: A Little Help needed walking in hospital room?: A Little Help needed climbing 3-5 steps with a railing? : A Little 6 Click Score: 12    End of Session Equipment Utilized During Treatment: Gait belt;Oxygen Activity Tolerance: Patient tolerated treatment well Patient left: in chair;with call bell/phone within reach;with nursing/sitter in room (nurse tech in room assisting with meal) Nurse Communication: Mobility status PT Visit Diagnosis: Unsteadiness on feet (R26.81);Muscle weakness (generalized) (M62.81)    Time: 1610-9604 PT Time Calculation (min) (ACUTE ONLY): 31 min   Charges:   PT Evaluation $PT Eval Moderate Complexity: 1 Procedure PT Treatments $Gait Training: 8-22 mins   PT G Codes:        [facility] [phone]   Angelina Ok [facility] 04/24/2017, 10:52 AM

## 2017-04-24 NOTE — Progress Notes (Signed)
Pt. Transferred from by bed. Pt. Arrived in alert and stable condition. Pt. Responsive to name and follows commands. Call light placed within reach. Pt. Placed on telemetry. CCMD notified of pts. Arrival to the floor.

## 2017-04-24 NOTE — Care Management Important Message (Signed)
Important Message  Patient Details  Name: Caleb NegusFred Carlton Delgado MRN: [id number] Date of Birth: 10/23/1925   Medicare Important Message Given:  Yes    [name] 04/24/2017, 11:25 AM

## 2017-04-24 NOTE — Progress Notes (Signed)
PROGRESS NOTE    Caleb Delgado  ZOX:[id number] DOB: July 17, 1926 DOA: 04/20/2017 PCP: Loyal Jacobson, MD    Brief Narrative: 83 year old male with hypertension, hyperlipidemia, depression, UC, by of valve AVR, coronary artery disease with history of CABG, diastolic CHF, dementia, stroke, nonverbal status, A. fib on Coumadin who was admitted on 7/16 with hypoxic respiratory failure in the setting of probable aspiration pneumonia. Patient also had nausea, vomiting prior to admission.   Assessment & Plan:   Principal Problem:   Acute on chronic respiratory failure with hypoxia (HCC) Active Problems:   Expressive aphasia   HTN (hypertension)   S/P AVR (aortic valve replacement)   Atrial fibrillation (HCC)   CAD (coronary artery disease)   Chronic diastolic CHF (congestive heart failure) (HCC)   CKD (chronic kidney disease), stage III   Sepsis (HCC)   CAP (community acquired pneumonia)   Ulcerative colitis (HCC)   Stroke (HCC)   Abdominal pain   Aspiration pneumonia (HCC)   Acute on chronic respiratory failure with hypoxia secondary to aspiration pneumonia:  On zosyn and zithromax to complete the course. Day 4 of antibiotics.  SLP eval and recommendations given and he is on dysphagia 1 diet.  He is afebrile and leukocytosis is improving, does not appear toxic.  He is still on 2 lit of [place] oxygen to keep sats greater than 90%.  Plan to wean him off oxygen and work with PT .    H/o UC;  No episodes of diarrhea.     Hyperlipidemia:  Resume pravachol.   Dementia:  No signs of agitation.  Resume aricept.   Hypertension: bp parameters well controlled.   prn hydralazine.    H/o afib and s/p AVR:  His  rate well controlled.  On coumadin for anticoagulation at home. One dose of VIT K ordered for supra therapeutic INR earlier during admission. INR is therapeutic today. Resume coumadin as per pharmacy, as surgery recommended no further intervention for his gall stones.     Nausea, vomiting and abdominal pain:  Currently he is asymptomatic. US abdomen shows gb thickening and gall stones. Murphy's sign is negative.  Surgery consulted to see if GB needs to come out vs HIDA scan.  Since he is able to tolerate diet without any issues, recommend no further intervention.    Mild acute on chronic diastolic CHF:  Currently euvolemic.    Stage 3 CKD:  Creatinine at baseline.    CAD: Pt denies chest pain. Troponin elevation , possibly from demand ischemia from infection and possibly CHF.   H/o CVA:  Follows simple commands , non focal at this time.      DVT prophylaxis: (scd's) Code Status: DNR) Family Communication: none at bedside.  Disposition Plan: home pending PT evaluation. Possibly home as he has 24 hour home care.    Consultants:   Surgery  SLP  Procedures: CT abd and pelvis.  US abdomen.    Antimicrobials: zithromax and zosyn since admission.   Subjective: Appears comfortable.    Objective: Vitals:   04/24/17 0600 04/24/17 0700 04/24/17 0802 04/24/17 1212  BP: (!) 136/40 (!) 141/47 (!) 151/41 (!) 148/44  Pulse: 78 80 71 65  Resp: (!) 25 (!) 24 10 11   Temp:   98.6 F (37 C) 98.9 F (37.2 C)  TempSrc:   Oral Axillary  SpO2: 97% 96% 98% 98%  Weight:      Height:        Intake/Output Summary (Last 24 hours) at 04/24/17 1245  Last data filed at 04/24/17 1100  Gross per 24 hour  Intake              465 ml  Output                0 ml  Net              465 ml   Filed Weights   04/21/17 0109  Weight: 77.4 kg (170 lb 10.2 oz)    Examination:  General exam: Appears calm and comfortable on [place] oxygen.  Respiratory system: Clear to auscultation. Respiratory effort normal. No wheezing or rhonchi Cardiovascular system: S1 & S2 heard, RRR.No pedal edema. Gastrointestinal system: Abdomen is nondistended, soft and nontender. No organomegaly or masses felt. Normal bowel sounds heard. Central nervous system: Alert and not  oriented. Able to move all extremities.  Extremities: Symmetric 5 x 5 power. Skin: No rashes, lesions or ulcers Psychiatry: calm and comfortable and pleasant.    Data Reviewed: I have personally reviewed following labs and imaging studies  CBC:  Recent Labs Lab 04/20/17 2135 04/21/17 0149 04/22/17 0519 04/23/17 0455 04/24/17 0942  WBC 22.6* 18.3* 14.0* 11.4* 11.0*  NEUTROABS 19.5* 16.3*  --   --   --   HGB 9.8* 8.1* 8.3* 7.7* 9.0*  HCT 29.8* 25.1* 26.3* 24.3* 28.7*  MCV 102.4* 100.4* 101.2* 101.3* 102.1*  PLT 328 235 207 189 214   Basic Metabolic Panel:  Recent Labs Lab 04/20/17 2135 04/21/17 0149 04/22/17 0519 04/23/17 0455  NA 136 138 137 138  K 4.0 3.9 4.0 3.9  CL 100* 105 104 106  CO2 18* 23 25 26   GLUCOSE 218* 158* 143* 141*  BUN 25* 21* 25* 26*  CREATININE 1.32* 1.38* 1.46* 1.36*  CALCIUM 9.1 8.2* 8.9 8.7*   GFR: Estimated Creatinine Clearance: 40.6 mL/min (A) (by C-G formula based on SCr of 1.36 mg/dL (H)). Liver Function Tests:  Recent Labs Lab 04/21/17 0149 04/22/17 0519  AST 38 46*  ALT 23 36  ALKPHOS 65 63  BILITOT 1.2 1.5*  PROT 6.5 6.9  ALBUMIN 3.1* 3.2*    Recent Labs Lab 04/21/17 0149  LIPASE 72*   No results for input(s): AMMONIA in the last 168 hours. Coagulation Profile:  Recent Labs Lab 04/20/17 2135 04/21/17 0723 04/22/17 0519 04/23/17 0455 04/24/17 0345  INR 6.64* 7.71* 2.46 2.06 2.08   Cardiac Enzymes:  Recent Labs Lab 04/20/17 2135 04/21/17 0149 04/21/17 0723 04/21/17 1415  TROPONINI 0.05* 0.29* 0.76* 0.68*   BNP (last 3 results) No results for input(s): PROBNP in the last 8760 hours. HbA1C: No results for input(s): HGBA1C in the last 72 hours. CBG: No results for input(s): GLUCAP in the last 168 hours. Lipid Profile: No results for input(s): CHOL, HDL, LDLCALC, TRIG, CHOLHDL, LDLDIRECT in the last 72 hours. Thyroid Function Tests: No results for input(s): TSH, T4TOTAL, FREET4, T3FREE, THYROIDAB in  the last 72 hours. Anemia Panel: No results for input(s): VITAMINB12, FOLATE, FERRITIN, TIBC, IRON, RETICCTPCT in the last 72 hours. Sepsis Labs:  Recent Labs Lab 04/20/17 2327 04/21/17 0149 04/21/17 0418 04/21/17 0723  PROCALCITON  --  0.52  --   --   LATICACIDVEN TEST REQUEST RECEIVED WITHOUT APPROPRIATE SPECIMEN 2.6* 2.0* 2.1*    Recent Results (from the past 240 hour(s))  Blood culture (routine x 2)     Status: None (Preliminary result)   Collection Time: 04/20/17 10:00 PM  Result Value Ref Range Status   Specimen Description BLOOD LEFT  ARM  Final   Special Requests   Final    BOTTLES DRAWN AEROBIC AND ANAEROBIC Blood Culture adequate volume   Culture   Final    NO GROWTH 3 DAYS Performed at [facility], [address] [place], [place] [address]    Report Status PENDING  Incomplete  Blood culture (routine x 2)     Status: None (Preliminary result)   Collection Time: 04/20/17 10:00 PM  Result Value Ref Range Status   Specimen Description BLOOD RIGHT ARM  Final   Special Requests   Final    BOTTLES DRAWN AEROBIC AND ANAEROBIC Blood Culture adequate volume   Culture   Final    NO GROWTH 3 DAYS Performed at [facility], [address], [place], [place] [address]    Report Status PENDING  Incomplete  MRSA PCR Screening     Status: None   Collection Time: 04/21/17  1:16 AM  Result Value Ref Range Status   MRSA by PCR NEGATIVE NEGATIVE Final    Comment:        The GeneXpert MRSA Assay (FDA approved for NASAL specimens only), is one component of a comprehensive MRSA colonization surveillance program. It is not intended to diagnose MRSA infection nor to guide or monitor treatment for MRSA infections.   Respiratory Panel by PCR     Status: None   Collection Time: 04/21/17  1:16 AM  Result Value Ref Range Status   Adenovirus NOT DETECTED NOT DETECTED Final   Coronavirus 229E NOT DETECTED NOT DETECTED Final   Coronavirus HKU1 NOT DETECTED NOT DETECTED  Final   Coronavirus NL63 NOT DETECTED NOT DETECTED Final   Coronavirus OC43 NOT DETECTED NOT DETECTED Final   Metapneumovirus NOT DETECTED NOT DETECTED Final   Rhinovirus / Enterovirus NOT DETECTED NOT DETECTED Final   Influenza A NOT DETECTED NOT DETECTED Final   Influenza B NOT DETECTED NOT DETECTED Final   Parainfluenza Virus 1 NOT DETECTED NOT DETECTED Final   Parainfluenza Virus 2 NOT DETECTED NOT DETECTED Final   Parainfluenza Virus 3 NOT DETECTED NOT DETECTED Final   Parainfluenza Virus 4 NOT DETECTED NOT DETECTED Final   Respiratory Syncytial Virus NOT DETECTED NOT DETECTED Final   Bordetella pertussis NOT DETECTED NOT DETECTED Final   Chlamydophila pneumoniae NOT DETECTED NOT DETECTED Final   Mycoplasma pneumoniae NOT DETECTED NOT DETECTED Final         Radiology Studies: US Abdomen Limited Ruq  Result Date: 04/23/2017 CLINICAL DATA:  83 year old male with right upper quadrant abdominal pain and nausea. EXAM: ULTRASOUND ABDOMEN LIMITED RIGHT UPPER QUADRANT COMPARISON:  Abdominal CT dated 04/21/2017 FINDINGS: Gallbladder: There is sludge and multiple stones within the gallbladder. The gallbladder wall appears edematous and measures approximately 11 mm in thickness. A small pericholecystic fluid may be present. Negative sonographic Murphy's sign. Common bile duct: Diameter: 5 mm Liver: The liver appears unremarkable as visualized. The main portal vein is patent with hepatopetal flow. IMPRESSION: Cholelithiasis with thickened appearance of the gallbladder concerning for cholecystitis, acute versus chronic. Correlation with clinical exam recommended. A hepatobiliary scintigraphy may provide better evaluation of the gallbladder if there is a high clinical concern for acute cholecystitis . Electronically Signed   By: Elgie Collard M.D.   On: 04/23/2017 03:39        Scheduled Meds: . cholecalciferol  1,000 Units Oral QHS  . donepezil  10 mg Oral QHS  . escitalopram  10 mg Oral  Daily  . folic acid  0.5 mg Oral Daily  . gabapentin  300 mg Oral QHS  . magnesium oxide  200 mg Oral Daily  . multivitamin with minerals  1 tablet Oral Daily  . pravastatin  40 mg Oral QPM  . sulfaSALAzine  500 mg Oral BID  . tamsulosin  0.4 mg Oral QPC supper  . warfarin  2.5 mg Oral ONCE-1800  . Warfarin - Pharmacist Dosing Inpatient   Does not apply q1800   Continuous Infusions: . azithromycin Stopped (04/24/17 0455)  . piperacillin-tazobactam (ZOSYN)  IV 3.375 g (04/24/17 1038)  . sodium chloride Stopped (04/21/17 0147)     LOS: 4 days    Time spent: 35 minutes    Tyjah Hai, MD [facility] Pager [phone] If 7PM-7AM, please contact night-coverage [email or web address] Password [email or web address] 04/24/2017, 12:45 PM

## 2017-04-25 LAB — BASIC METABOLIC PANEL
ANION GAP: 10 (ref 5–15)
BUN: 28 mg/dL — AB (ref 6–20)
CALCIUM: 9 mg/dL (ref 8.9–10.3)
CO2: 25 mmol/L (ref 22–32)
Chloride: 107 mmol/L (ref 101–111)
Creatinine, Ser: 1.44 mg/dL — ABNORMAL HIGH (ref 0.61–1.24)
GFR calc Af Amer: 48 mL/min — ABNORMAL LOW (ref >60)
GFR calc non Af Amer: 42 mL/min — ABNORMAL LOW (ref >60)
GLUCOSE: 142 mg/dL — AB (ref 65–99)
Potassium: 3.7 mmol/L (ref 3.5–5.1)
Sodium: 142 mmol/L (ref 135–145)

## 2017-04-25 LAB — PROTIME-INR
INR: 2.44
PROTHROMBIN TIME: 26.9 s — AB (ref 11.4–15.2)

## 2017-04-25 LAB — LEGIONELLA PNEUMOPHILA SEROGP 1 UR AG: L. PNEUMOPHILA SEROGP 1 UR AG: NEGATIVE

## 2017-04-25 MED ORDER — WARFARIN SODIUM 3 MG PO TABS
1.5000 mg | ORAL_TABLET | Freq: Once | ORAL | Status: AC
  Administered 2017-04-25: 1.5 mg via ORAL
  Filled 2017-04-25: qty 0.5

## 2017-04-25 MED ORDER — RESOURCE THICKENUP CLEAR PO POWD: ORAL | Status: AC

## 2017-04-25 MED ORDER — ESCITALOPRAM OXALATE 10 MG PO TABS: 10.0000 mg | ORAL_TABLET | Freq: Every day | ORAL | 0 refills | Status: AC

## 2017-04-25 NOTE — Progress Notes (Signed)
ANTICOAGULATION CONSULT NOTE - Follow Up Consult  Pharmacy Consult for Coumadin Indication: Afib  Patient Measurements: Height: 5' 11.5" (181.6 cm) Weight: 166 lb 12.8 oz (75.7 kg) IBW/kg (Calculated) : 76.45 Heparin Dosing Weight: 77 kg  Vital Signs: Temp: 98.6 F (37 C) (07/20 0345) Temp Source: Oral (07/20 0345) BP: 144/53 (07/20 0345) Pulse Rate: 65 (07/20 0345)  Labs:  Recent Labs  04/23/17 0455 04/24/17 0345 04/24/17 0942 04/25/17 0356  HGB 7.7*  --  9.0*  --   HCT 24.3*  --  28.7*  --   PLT 189  --  214  --   LABPROT 23.5* 23.7*  --  26.9*  INR 2.06 2.08  --  2.44  CREATININE 1.36*  --   --  1.44*    Estimated Creatinine Clearance: 38 mL/min (A) (by C-G formula based on SCr of 1.44 mg/dL (H)).   Assessment: 89 YOM on Coumadin PTA for hx Afib (also with hx tissue AVR) who presented with abdominal pain. INR elevated at 6.64 on admission and peaked at 7.71.  Vit K given on 04/21/17.  Coumadin was held 7/16-7/18 due to possible surgery/intervention plans for cholelithiasis. No surgery is anticipated and pharmacy consulted to resume Coumadin on 04/24/17.   INR trended up to 2.44 post Coumadin yesterday.  Patient appears to be sensitive to Coumadin.  No bleeding reported.   Goal of Therapy:  INR 2-3    Plan:  - Coumadin 1.5mg  today - Daily PT / INR - Consider stopping azithromycin today and Zosyn tomorrow   Joycelynn Fritsche D. Laney Potashang, PharmD, BCPS Pager:  [phone] 04/25/2017, 8:15 AM

## 2017-04-25 NOTE — Progress Notes (Signed)
  Speech Language Pathology Treatment: Dysphagia  Patient Details Name: Caleb Delgado MRN: [id number] DOB: 01/12/1926 Today's Date: 04/25/2017 Time: [id number] SLP Time Calculation (min) (ACUTE ONLY): 17 min  Assessment / Plan / Recommendation Clinical Impression  Dysphagia treatment provided for diet tolerance/ attempts at compensatory strategy training. Similar observations noted this date as on recent diet check. The pt had a prolonged oral phase with dysphagia 1 consistency, suspect tongue pumping, occasional mild oral residuals post-swallow. Pt needed assistance for self-feeding today (set up utensil in hand). Was able to follow most simple commands and answer yes/ no questions by nodding/ shaking head to indicate food preference; however, pt had difficulty initiating a throat clear despite maximal verbal/ visual cues (instead vocalized "ah"). Current diet remains appropriate- dysphagia 1/ honey-thick liquids; would also recommend full supervision as the pt needs occasional assistance with self-feeding. Will continue to follow for diet tolerance/ consider advancement and training for throat clear for clearing possible aspirated material. Would also consider [place] Levels[place]razier free water protocol for this pt between meals following oral care.    HPI HPI: Caleb FabianFred Carlton Hughesis a 83 y.o.malewith medical history significant of hypertension, hyperlipidemia, depression, ulcerative colitis, s/p of AVR (biovalve), CAD, s/p of CABG, dCHF, dementia, stroke, nonverbal status, BPH, atrial fibrillation on Coumadin, who presents with shortness of breath, cough, nausea, vomiting and abdominal pain. He vomited 4 times without blood in the vomitus. Chest x-ray showed left lower lobe infiltration, indicating CAP. Patient has nausea, vomiting, indicating possible aspiration pneumonia too. Patient has a severe sepsis with lactic acid 8.38, leukocytosis, tachycardia and tachypnea. Pt is also fluid overloaded.       SLP  Plan  Continue with current plan of care       Recommendations  Diet recommendations: Dysphagia 1 (puree);Honey-thick liquid Liquids provided via: Cup Medication Administration: Crushed with puree Supervision: Staff to assist with self feeding;Full supervision/cueing for compensatory strategies Compensations: Slow rate;Small sips/bites;Clear throat intermittently Postural Changes and/or Swallow Maneuvers: Seated upright 90 degrees                Oral Care Recommendations: Oral care BID;Oral care prior to ice chip/H20 Follow up Recommendations: 24 hour supervision/assistance SLP Visit Diagnosis: Dysphagia, oropharyngeal phase (R13.12) Plan: Continue with current plan of care       GO                Metro KungAmy K Oleksiak, MA, CCC-SLP 04/25/2017, 9:28 AM [phone]

## 2017-04-25 NOTE — Progress Notes (Signed)
PROGRESS NOTE    [name]  WUJ:[id number] DOB: 09/06/1926 DOA: 04/20/2017 PCP: Loyal JacobsonKalish, Michael, MD    Brief Narrative: 83 year old male with hypertension, hyperlipidemia, depression, UC, by of valve AVR, coronary artery disease with history of CABG, diastolic CHF, dementia, stroke, nonverbal status, A. fib on Coumadin who was admitted on 7/16 with hypoxic respiratory failure in the setting of probable aspiration pneumonia. Patient also had nausea, vomiting prior to admission. No new complaints today.   Assessment & Plan:   Principal Problem:   Acute on chronic respiratory failure with hypoxia (HCC) Active Problems:   Expressive aphasia   HTN (hypertension)   S/P AVR (aortic valve replacement)   Atrial fibrillation (HCC)   CAD (coronary artery disease)   Chronic diastolic CHF (congestive heart failure) (HCC)   CKD (chronic kidney disease), stage III   Sepsis (HCC)   CAP (community acquired pneumonia)   Ulcerative colitis (HCC)   Stroke (HCC)   Abdominal pain   Aspiration pneumonia (HCC)   Acute on chronic respiratory failure with hypoxia secondary to aspiration pneumonia:  On zosyn and zithromax to complete the course. Day 5 of antibiotics.  SLP eval and recommendations given and he is on dysphagia 1 diet. Repeat eval is continue with the same.  He is afebrile and leukocytosis is improving, does not appear toxic.  He is still on 2 lit of [place] oxygen to keep sats greater than 90%.  PT eval recommended to d/c home with home care.    H/o UC;  No episodes of diarrhea.     Hyperlipidemia:  Resume pravachol.   Dementia:  No signs of agitation.  Resume aricept.   Hypertension: bp parameters well controlled.   prn hydralazine.    H/o afib and s/p AVR:  His  rate well controlled.  On coumadin for anticoagulation at home. One dose of VIT K ordered for supra therapeutic INR earlier during admission. INR is therapeutic today. Resume coumadin as per pharmacy, as  surgery recommended no further intervention for his gall stones.    Nausea, vomiting and abdominal pain:  Currently he is asymptomatic. US abdomen shows gb thickening and gall stones. Murphy's sign is negative.  Surgery consulted to see if GB needs to come out vs HIDA scan.  Since he is able to tolerate diet without any issues, recommend no further intervention.  No nausea or vomiting.    Mild acute on chronic diastolic CHF:  Currently euvolemic.    Stage 3 CKD:  Creatinine at baseline.    CAD: Pt denies chest pain. Troponin elevation , possibly from demand ischemia from infection and possibly CHF.   H/o CVA:  Follows simple commands , non focal at this time.      DVT prophylaxis: (scd's) Code Status: DNR) Family Communication: none at bedside.  Disposition Plan: home tomorrow.    Consultants:   Surgery  SLP  Procedures: CT abd and pelvis.  US abdomen.    Antimicrobials: zithromax and zosyn since admission.   Subjective: Denies any pain.  No vomiting.    Objective: Vitals:   04/24/17 2030 04/24/17 2346 04/25/17 0345 04/25/17 1300  BP: (!) 144/35 (!) 148/43 (!) 144/53 (!) 140/30  Pulse: 68 61 65 68  Resp: 18 16 17  (!) 24  Temp: 98 F (36.7 C) 98.2 F (36.8 C) 98.6 F (37 C) (!) 97.5 F (36.4 C)  TempSrc: Oral Oral Oral Oral  SpO2: 95% 93% 94% 95%  Weight: 76.5 kg (168 lb 10.4 oz)  75.7  kg (166 lb 12.8 oz)   Height:        Intake/Output Summary (Last 24 hours) at 04/25/17 1635 Last data filed at 04/25/17 1300  Gross per 24 hour  Intake             1020 ml  Output              225 ml  Net              795 ml   Filed Weights   04/21/17 0109 04/24/17 2030 04/25/17 0345  Weight: 77.4 kg (170 lb 10.2 oz) 76.5 kg (168 lb 10.4 oz) 75.7 kg (166 lb 12.8 oz)    Examination: stable, no change .   General exam: Appears calm and comfortable on [place] oxygen.  Respiratory system: Clear to auscultation. Respiratory effort normal. No wheezing or  rhonchi Cardiovascular system: S1 & S2 heard, RRR.No pedal edema. Gastrointestinal system: Abdomen is nondistended, soft and nontender. No organomegaly or masses felt. Normal bowel sounds heard. Central nervous system: Alert and not oriented. Able to move all extremities.  Extremities: Symmetric 5 x 5 power. Skin: No rashes, lesions or ulcers Psychiatry: calm and comfortable and pleasant.    Data Reviewed: I have personally reviewed following labs and imaging studies  CBC:  Recent Labs Lab 04/20/17 2135 04/21/17 0149 04/22/17 0519 04/23/17 0455 04/24/17 0942  WBC 22.6* 18.3* 14.0* 11.4* 11.0*  NEUTROABS 19.5* 16.3*  --   --   --   HGB 9.8* 8.1* 8.3* 7.7* 9.0*  HCT 29.8* 25.1* 26.3* 24.3* 28.7*  MCV 102.4* 100.4* 101.2* 101.3* 102.1*  PLT 328 235 207 189 214   Basic Metabolic Panel:  Recent Labs Lab 04/20/17 2135 04/21/17 0149 04/22/17 0519 04/23/17 0455 04/25/17 0356  NA 136 138 137 138 142  K 4.0 3.9 4.0 3.9 3.7  CL 100* 105 104 106 107  CO2 18* 23 25 26 25   GLUCOSE 218* 158* 143* 141* 142*  BUN 25* 21* 25* 26* 28*  CREATININE 1.32* 1.38* 1.46* 1.36* 1.44*  CALCIUM 9.1 8.2* 8.9 8.7* 9.0   GFR: Estimated Creatinine Clearance: 38 mL/min (A) (by C-G formula based on SCr of 1.44 mg/dL (H)). Liver Function Tests:  Recent Labs Lab 04/21/17 0149 04/22/17 0519  AST 38 46*  ALT 23 36  ALKPHOS 65 63  BILITOT 1.2 1.5*  PROT 6.5 6.9  ALBUMIN 3.1* 3.2*    Recent Labs Lab 04/21/17 0149  LIPASE 72*   No results for input(s): AMMONIA in the last 168 hours. Coagulation Profile:  Recent Labs Lab 04/21/17 0723 04/22/17 0519 04/23/17 0455 04/24/17 0345 04/25/17 0356  INR 7.71* 2.46 2.06 2.08 2.44   Cardiac Enzymes:  Recent Labs Lab 04/20/17 2135 04/21/17 0149 04/21/17 0723 04/21/17 1415  TROPONINI 0.05* 0.29* 0.76* 0.68*   BNP (last 3 results) No results for input(s): PROBNP in the last 8760 hours. HbA1C: No results for input(s): HGBA1C in the  last 72 hours. CBG: No results for input(s): GLUCAP in the last 168 hours. Lipid Profile: No results for input(s): CHOL, HDL, LDLCALC, TRIG, CHOLHDL, LDLDIRECT in the last 72 hours. Thyroid Function Tests: No results for input(s): TSH, T4TOTAL, FREET4, T3FREE, THYROIDAB in the last 72 hours. Anemia Panel: No results for input(s): VITAMINB12, FOLATE, FERRITIN, TIBC, IRON, RETICCTPCT in the last 72 hours. Sepsis Labs:  Recent Labs Lab 04/20/17 2327 04/21/17 0149 04/21/17 0418 04/21/17 0723  PROCALCITON  --  0.52  --   --   LATICACIDVEN TEST  REQUEST RECEIVED WITHOUT APPROPRIATE SPECIMEN 2.6* 2.0* 2.1*    Recent Results (from the past 240 hour(s))  Blood culture (routine x 2)     Status: None (Preliminary result)   Collection Time: 04/20/17 10:00 PM  Result Value Ref Range Status   Specimen Description BLOOD LEFT ARM  Final   Special Requests   Final    BOTTLES DRAWN AEROBIC AND ANAEROBIC Blood Culture adequate volume   Culture   Final    NO GROWTH 4 DAYS Performed at [facility], [address] [place], [place] [address]    Report Status PENDING  Incomplete  Blood culture (routine x 2)     Status: None (Preliminary result)   Collection Time: 04/20/17 10:00 PM  Result Value Ref Range Status   Specimen Description BLOOD RIGHT ARM  Final   Special Requests   Final    BOTTLES DRAWN AEROBIC AND ANAEROBIC Blood Culture adequate volume   Culture   Final    NO GROWTH 4 DAYS Performed at [facility], [address], [place], [place] [address]    Report Status PENDING  Incomplete  MRSA PCR Screening     Status: None   Collection Time: 04/21/17  1:16 AM  Result Value Ref Range Status   MRSA by PCR NEGATIVE NEGATIVE Final    Comment:        The GeneXpert MRSA Assay (FDA approved for NASAL specimens only), is one component of a comprehensive MRSA colonization surveillance program. It is not intended to diagnose MRSA infection nor to guide or monitor treatment  for MRSA infections.   Respiratory Panel by PCR     Status: None   Collection Time: 04/21/17  1:16 AM  Result Value Ref Range Status   Adenovirus NOT DETECTED NOT DETECTED Final   Coronavirus 229E NOT DETECTED NOT DETECTED Final   Coronavirus HKU1 NOT DETECTED NOT DETECTED Final   Coronavirus NL63 NOT DETECTED NOT DETECTED Final   Coronavirus OC43 NOT DETECTED NOT DETECTED Final   Metapneumovirus NOT DETECTED NOT DETECTED Final   Rhinovirus / Enterovirus NOT DETECTED NOT DETECTED Final   Influenza A NOT DETECTED NOT DETECTED Final   Influenza B NOT DETECTED NOT DETECTED Final   Parainfluenza Virus 1 NOT DETECTED NOT DETECTED Final   Parainfluenza Virus 2 NOT DETECTED NOT DETECTED Final   Parainfluenza Virus 3 NOT DETECTED NOT DETECTED Final   Parainfluenza Virus 4 NOT DETECTED NOT DETECTED Final   Respiratory Syncytial Virus NOT DETECTED NOT DETECTED Final   Bordetella pertussis NOT DETECTED NOT DETECTED Final   Chlamydophila pneumoniae NOT DETECTED NOT DETECTED Final   Mycoplasma pneumoniae NOT DETECTED NOT DETECTED Final         Radiology Studies: No results found.      Scheduled Meds: . cholecalciferol  1,000 Units Oral QHS  . donepezil  10 mg Oral QHS  . escitalopram  10 mg Oral Daily  . folic acid  0.5 mg Oral Daily  . gabapentin  300 mg Oral QHS  . magnesium oxide  200 mg Oral Daily  . multivitamin with minerals  1 tablet Oral Daily  . pravastatin  40 mg Oral QPM  . sulfaSALAzine  500 mg Oral BID  . tamsulosin  0.4 mg Oral QPC supper  . warfarin  1.5 mg Oral ONCE-1800  . Warfarin - Pharmacist Dosing Inpatient   Does not apply q1800   Continuous Infusions: . azithromycin Stopped (04/25/17 0600)  . piperacillin-tazobactam (ZOSYN)  IV  3.375 g (04/25/17 1119)  . sodium chloride Stopped (04/21/17 0147)     LOS: 5 days    Time spent: 25 minutes    Zalen Sequeira, MD [facility] Pager [phone] If 7PM-7AM, please contact  night-coverage [email or web address] Password [email or web address] 04/25/2017, 4:35 PM

## 2017-04-25 NOTE — Discharge Instructions (Signed)

## 2017-04-26 LAB — CULTURE, BLOOD (ROUTINE X 2)
Culture: NO GROWTH
Culture: NO GROWTH
SPECIAL REQUESTS: ADEQUATE
SPECIAL REQUESTS: ADEQUATE

## 2017-04-26 LAB — PROTIME-INR
INR: 2.44
PROTHROMBIN TIME: 26.9 s — AB (ref 11.4–15.2)

## 2017-04-26 MED ORDER — AMOXICILLIN-POT CLAVULANATE 500-125 MG PO TABS: 1.0000 | ORAL_TABLET | Freq: Two times a day (BID) | ORAL | 0 refills | Status: AC

## 2017-04-26 MED ORDER — AMOXICILLIN-POT CLAVULANATE 500-125 MG PO TABS
1.0000 | ORAL_TABLET | Freq: Two times a day (BID) | ORAL | Status: DC
  Filled 2017-04-26: qty 1

## 2017-04-26 NOTE — Discharge Summary (Signed)
Physician Discharge Summary  Caleb Delgado JYN:[id number] DOB: 10-Mar-1926 DOA: 04/20/2017  PCP: Loyal Jacobson, MD  Admit date: 04/20/2017 Discharge date: 04/26/2017  Admitted From: Home Disposition: Home  Recommendations for Outpatient Follow-up:  1. Follow up with PCP in 1-2 weeks 2. Please obtain BMP/CBC in one week   Discharge Condition: STABLE CODE STATUS: DNR Diet recommendation: Heart Healthy  Brief/Interim Summary: 83 year old male with hypertension, hyperlipidemia, depression, UC, by of valve AVR, coronary artery disease with history of CABG, diastolic CHF, dementia, stroke, nonverbal status, A. fib on Coumadin who was admitted on 7/16 with hypoxic respiratory failure in the setting of probable aspiration pneumonia. Patient also had nausea, vomiting prior to admission. He was found to have gall stones. Underwent US abdomen, showed some gb thickening, surgery consulted , recommended non further work up.   Discharge Diagnoses:  Principal Problem:   Acute on chronic respiratory failure with hypoxia (HCC) Active Problems:   Expressive aphasia   HTN (hypertension)   S/P AVR (aortic valve replacement)   Atrial fibrillation (HCC)   CAD (coronary artery disease)   Chronic diastolic CHF (congestive heart failure) (HCC)   CKD (chronic kidney disease), stage III   Sepsis (HCC)   CAP (community acquired pneumonia)   Ulcerative colitis (HCC)   Stroke (HCC)   Abdominal pain   Aspiration pneumonia (HCC)  Acute on chronic respiratory failure with hypoxia secondary to aspiration pneumonia:  On zosyn and zithromax to complete the course.discharged on oral antibiotics to complete the course.  SLP eval and recommendations given and he is on dysphagia 1 diet. Repeat eval is continue with the same.  He is afebrile and leukocytosis is improving, does not appear toxic.  We have weaned him off the oxygen.  PT eval recommended to d/c home with home care.    H/o UC;  No episodes  of diarrhea.     Hyperlipidemia:  Resume pravachol.   Dementia:  No signs of agitation.  Resume aricept.   Hypertension: bp parameters well controlled.   prn hydralazine.    H/o afib and s/p AVR:  His  rate well controlled.  On coumadin for anticoagulation at home. One dose of VIT K ordered for supra therapeutic INR earlier during admission. INR is therapeutic today. Resume coumadin as per pharmacy, as surgery recommended no further intervention for his gall stones.    Nausea, vomiting and abdominal pain:  Currently he is asymptomatic. US abdomen shows gb thickening and gall stones. Murphy's sign is negative.  Surgery consulted to see if if we need to remove GB vs HIDA scan.  Since he is able to tolerate diet without any issues, recommend no further intervention.  No nausea or vomiting.    Mild acute on chronic diastolic CHF:  Currently euvolemic.    Stage 3 CKD:  Creatinine at baseline.    CAD: Pt denies chest pain. Troponin elevation , possibly from demand ischemia from infection and possibly CHF.   H/o CVA:  Follows simple commands , non focal at this time.      Discharge Instructions  Discharge Instructions    Diet - low sodium heart healthy    Complete by:  As directed    Discharge instructions    Complete by:  As directed    Please follow up with PCP in 1 to 2 weeks,post hospitalization visit.  Please see the surgeon as needed for gall stones if his symptoms re occur.     Allergies as of 04/26/2017  Reactions   Nebivolol Anaphylaxis   Tramadol Nausea Only      Medication List    STOP taking these medications   amLODipine 10 MG tablet Commonly known as:  NORVASC   potassium chloride 10 MEQ tablet Commonly known as:  K-DUR     TAKE these medications   albuterol 108 (90 Base) MCG/ACT inhaler Commonly known as:  PROVENTIL HFA;VENTOLIN HFA Inhale 1-2 puffs into the lungs every 6 (six) hours as needed for wheezing or  shortness of breath.   amoxicillin-clavulanate 500-125 MG tablet Commonly known as:  AUGMENTIN Take 1 tablet (500 mg total) by mouth 2 (two) times daily.   Cholecalciferol 1000 units Tbdp Take 1,000 Units by mouth at bedtime.   CO Q 10 PO Take 1 capsule by mouth every evening.   donepezil 10 MG tablet Commonly known as:  ARICEPT Take 10 mg by mouth at bedtime.   escitalopram 10 MG tablet Commonly known as:  LEXAPRO Take 1 tablet (10 mg total) by mouth daily.   folic acid 0.5 MG tablet Commonly known as:  FOLVITE Take 0.5 mg by mouth daily.   furosemide 20 MG tablet Commonly known as:  LASIX Take 1 tablet (20 mg total) by mouth daily.   gabapentin 600 MG tablet Commonly known as:  NEURONTIN Take 600 mg by mouth at bedtime.   Magnesium 250 MG Tabs Take 250 mg by mouth daily.   multivitamin tablet Take 1 tablet by mouth daily.   pravastatin 40 MG tablet Commonly known as:  PRAVACHOL TAKE 1 TABLET BY MOUTH EVERY DAY IN THE EVENING What changed:  See the new instructions.   RESOURCE THICKENUP CLEAR Powd Use as needed.   sulfaSALAzine 500 MG tablet Commonly known as:  AZULFIDINE Take 500 mg by mouth 2 (two) times daily.   tamsulosin 0.4 MG Caps capsule Commonly known as:  FLOMAX Take 0.4 mg by mouth daily after supper.   traZODone 50 MG tablet Commonly known as:  DESYREL Take 50 mg by mouth at bedtime.   warfarin 5 MG tablet Commonly known as:  COUMADIN TAKE AS DIRECTED What changed:  how much to take  how to take this  when to take this  additional instructions      Follow-up Information    Loyal Jacobson, MD. Schedule an appointment as soon as possible for a visit in 1 week(s).   Specialty:  Family Medicine Contact information: [address] [place] [address] [phone]          Allergies  Allergen Reactions  . Nebivolol Anaphylaxis  . Tramadol Nausea Only    Consultations:  SLP evaluation.   Surgery.     Procedures/Studies: Ct Abdomen Pelvis Wo Contrast  Result Date: 04/22/2017 CLINICAL DATA:  83 year old male with epigastric abdominal pain, nausea vomiting. EXAM: CT ABDOMEN AND PELVIS WITHOUT CONTRAST TECHNIQUE: Multidetector CT imaging of the abdomen and pelvis was performed following the standard protocol without IV contrast. COMPARISON:  None. FINDINGS: Evaluation of this exam is limited in the absence of intravenous contrast. Lower chest: There small bilateral pleural effusions. There are patchy areas of ground-glass and nodular density primarily involving the lung bases most consistent with pneumonia. Correlation with clinical exam recommended. There is mild cardiomegaly. Aortic valve replacement. Multi vessel coronary vascular disease. There is hypoattenuation of the cardiac blood pool suggestive of a degree of anemia. Clinical correlation is recommended. There is no intra-abdominal free air. Small free fluid within the pelvis. Hepatobiliary: The liver is  unremarkable. Small scattered calcified liver granuloma. There is a 17 x 11 mm stone in the neck of the gallbladder. There is diffuse thickened and inflamed appearance of the gallbladder wall concerning for acute versus chronic cholecystitis. Ultrasound is recommended for further evaluation of the gallbladder. Pancreas: Unremarkable. No pancreatic ductal dilatation or surrounding inflammatory changes. Spleen: Normal in size without focal abnormality. Adrenals/Urinary Tract: The adrenal glands are unremarkable. Indeterminate an ill-defined 15 mm left renal upper pole hypodense lesion is not well characterized but may represent a cyst. There is no hydronephrosis or nephrolithiasis on either side. The visualized ureters appear unremarkable. There is diffuse thickened appearance of the bladder wall likely related to chronic bladder outlet obstruction. Correlation with urinalysis recommended to exclude superimposed UTI. Stomach/Bowel: Small hiatal  hernia. There is extensive sigmoid diverticulosis with muscular hypertrophy. No active inflammatory changes. There is moderate stool throughout the colon. No evidence of bowel obstruction or active inflammation. Normal appendix. Vascular/Lymphatic: There is advanced aortoiliac atherosclerotic disease. The aorta is mildly tortuous and ectatic measuring up to 2.8 cm. Evaluation of the vasculature is limited in the absence of intravenous contrast. No portal venous gas identified. There is no adenopathy. Reproductive: The prostate and seminal vesicles are grossly unremarkable. Other: There is diffuse subcutaneous soft tissue edema and anasarca. Musculoskeletal: Osteopenia with degenerative changes of the spine. L3 superior endplate old-appearing mild compression deformity. L2-L3 and L4-L5 disc desiccation with vacuum phenomena. No acute fracture. Median sternotomy wires noted. IMPRESSION: 1. Small bilateral pleural effusions with patchy bilateral airspace densities most consistent with pneumonia. Clinical correlation recommended. 2. Cholelithiasis with thickened appearance of the gallbladder wall. Further evaluation with ultrasound is recommended. 3. No hydronephrosis or nephrolithiasis. Correlation with urinalysis recommended to exclude UTI. 4. Colonic diverticulosis. No bowel obstruction or active inflammation. Normal appendix. 5. Cardiomegaly with evidence of anemia. 6. Aortic Atherosclerosis (ICD10-I70.0). A 2.8 cm abdominal aortic ectasia. Electronically Signed   By: Elgie CollardArash  Radparvar M.D.   On: 04/22/2017 03:49   Dg Chest Portable 1 View  Result Date: 04/20/2017 CLINICAL DATA:  Acute onset of respiratory difficulty.  Trampoline. EXAM: PORTABLE CHEST 1 VIEW COMPARISON:  The heart size is normal. Atherosclerotic calcifications are present at the aortic arch. FINDINGS: Heart is enlarged. Aortic atherosclerosis is present. Right lower lobe airspace disease is superimposed on chronic interstitial coarsening. Mild  edema is present as well. Chronic right pleural thickening is again noted. IMPRESSION: 1. Right lower lobe airspace disease concerning for lobar pneumonia. 2. Chronic right pleural thickening. 3. Cardiac enlargement and mild edema. Electronically Signed   By: Marin Robertshristopher  Mattern M.D.   On: 04/20/2017 21:52   [place] Abdomen Limited Ruq  Result Date: 04/23/2017 CLINICAL DATA:  83 year old male with right upper quadrant abdominal pain and nausea. EXAM: ULTRASOUND ABDOMEN LIMITED RIGHT UPPER QUADRANT COMPARISON:  Abdominal CT dated 04/21/2017 FINDINGS: Gallbladder: There is sludge and multiple stones within the gallbladder. The gallbladder wall appears edematous and measures approximately 11 mm in thickness. A small pericholecystic fluid may be present. Negative sonographic Murphy's sign. Common bile duct: Diameter: 5 mm Liver: The liver appears unremarkable as visualized. The main portal vein is patent with hepatopetal flow. IMPRESSION: Cholelithiasis with thickened appearance of the gallbladder concerning for cholecystitis, acute versus chronic. Correlation with clinical exam recommended. A hepatobiliary scintigraphy may provide better evaluation of the gallbladder if there is a high clinical concern for acute cholecystitis . Electronically Signed   By: Elgie CollardArash  Radparvar M.D.   On: 04/23/2017 03:39       Subjective:  No complaints. He appears comfortable.   Discharge Exam: Vitals:   04/25/17 2037 04/26/17 0600  BP: (!) 150/45 (!) 152/49  Pulse: 70 87  Resp: (!) 22 18  Temp: 97.7 F (36.5 C) 98.3 F (36.8 C)   Vitals:   04/25/17 0345 04/25/17 1300 04/25/17 2037 04/26/17 0600  BP: (!) 144/53 (!) 140/30 (!) 150/45 (!) 152/49  Pulse: 65 68 70 87  Resp: 17 (!) 24 (!) 22 18  Temp: 98.6 F (37 C) (!) 97.5 F (36.4 C) 97.7 F (36.5 C) 98.3 F (36.8 C)  TempSrc: Oral Oral Oral Oral  SpO2: 94% 95% 96% 90%  Weight: 75.7 kg (166 lb 12.8 oz)   74.9 kg (165 lb 3.2 oz)  Height:        General: Pt is  alert, awake, not in acute distress Cardiovascular: RRR, S1/S2 +, no rubs, no gallops Respiratory: CTA bilaterally, no wheezing, no rhonchi Abdominal: Soft, NT, ND, bowel sounds + Extremities: no edema, no cyanosis    The results of significant diagnostics from this hospitalization (including imaging, microbiology, ancillary and laboratory) are listed below for reference.     Microbiology: Recent Results (from the past 240 hour(s))  Blood culture (routine x 2)     Status: None (Preliminary result)   Collection Time: 04/20/17 10:00 PM  Result Value Ref Range Status   Specimen Description BLOOD LEFT ARM  Final   Special Requests   Final    BOTTLES DRAWN AEROBIC AND ANAEROBIC Blood Culture adequate volume   Culture   Final    NO GROWTH 4 DAYS Performed at [facility], [address], [place], [place] [address]    Report Status PENDING  Incomplete  Blood culture (routine x 2)     Status: None (Preliminary result)   Collection Time: 04/20/17 10:00 PM  Result Value Ref Range Status   Specimen Description BLOOD RIGHT ARM  Final   Special Requests   Final    BOTTLES DRAWN AEROBIC AND ANAEROBIC Blood Culture adequate volume   Culture   Final    NO GROWTH 4 DAYS Performed at [facility], [address] [place], [place] [address]    Report Status PENDING  Incomplete  MRSA PCR Screening     Status: None   Collection Time: 04/21/17  1:16 AM  Result Value Ref Range Status   MRSA by PCR NEGATIVE NEGATIVE Final    Comment:        The GeneXpert MRSA Assay (FDA approved for NASAL specimens only), is one component of a comprehensive MRSA colonization surveillance program. It is not intended to diagnose MRSA infection nor to guide or monitor treatment for MRSA infections.   Respiratory Panel by PCR     Status: None   Collection Time: 04/21/17  1:16 AM  Result Value Ref Range Status   Adenovirus NOT DETECTED NOT DETECTED Final   Coronavirus 229E NOT DETECTED NOT  DETECTED Final   Coronavirus HKU1 NOT DETECTED NOT DETECTED Final   Coronavirus NL63 NOT DETECTED NOT DETECTED Final   Coronavirus OC43 NOT DETECTED NOT DETECTED Final   Metapneumovirus NOT DETECTED NOT DETECTED Final   Rhinovirus / Enterovirus NOT DETECTED NOT DETECTED Final   Influenza A NOT DETECTED NOT DETECTED Final   Influenza B NOT DETECTED NOT DETECTED Final   Parainfluenza Virus 1 NOT DETECTED NOT DETECTED Final   Parainfluenza Virus 2 NOT DETECTED NOT DETECTED Final   Parainfluenza Virus 3 NOT DETECTED NOT DETECTED Final  Parainfluenza Virus 4 NOT DETECTED NOT DETECTED Final   Respiratory Syncytial Virus NOT DETECTED NOT DETECTED Final   Bordetella pertussis NOT DETECTED NOT DETECTED Final   Chlamydophila pneumoniae NOT DETECTED NOT DETECTED Final   Mycoplasma pneumoniae NOT DETECTED NOT DETECTED Final     Labs: BNP (last 3 results)  Recent Labs  05/18/16 1655 04/20/17 2135  BNP 1,244.9* 1,504.9*   Basic Metabolic Panel:  Recent Labs Lab 04/20/17 2135 04/21/17 0149 04/22/17 0519 04/23/17 0455 04/25/17 0356  NA 136 138 137 138 142  K 4.0 3.9 4.0 3.9 3.7  CL 100* 105 104 106 107  CO2 18* 23 25 26 25   GLUCOSE 218* 158* 143* 141* 142*  BUN 25* 21* 25* 26* 28*  CREATININE 1.32* 1.38* 1.46* 1.36* 1.44*  CALCIUM 9.1 8.2* 8.9 8.7* 9.0   Liver Function Tests:  Recent Labs Lab 04/21/17 0149 04/22/17 0519  AST 38 46*  ALT 23 36  ALKPHOS 65 63  BILITOT 1.2 1.5*  PROT 6.5 6.9  ALBUMIN 3.1* 3.2*    Recent Labs Lab 04/21/17 0149  LIPASE 72*   No results for input(s): AMMONIA in the last 168 hours. CBC:  Recent Labs Lab 04/20/17 2135 04/21/17 0149 04/22/17 0519 04/23/17 0455 04/24/17 0942  WBC 22.6* 18.3* 14.0* 11.4* 11.0*  NEUTROABS 19.5* 16.3*  --   --   --   HGB 9.8* 8.1* 8.3* 7.7* 9.0*  HCT 29.8* 25.1* 26.3* 24.3* 28.7*  MCV 102.4* 100.4* 101.2* 101.3* 102.1*  PLT 328 235 207 189 214   Cardiac Enzymes:  Recent Labs Lab 04/20/17 2135  04/21/17 0149 04/21/17 0723 04/21/17 1415  TROPONINI 0.05* 0.29* 0.76* 0.68*   BNP: Invalid input(s): POCBNP CBG: No results for input(s): GLUCAP in the last 168 hours. D-Dimer No results for input(s): DDIMER in the last 72 hours. Hgb A1c No results for input(s): HGBA1C in the last 72 hours. Lipid Profile No results for input(s): CHOL, HDL, LDLCALC, TRIG, CHOLHDL, LDLDIRECT in the last 72 hours. Thyroid function studies No results for input(s): TSH, T4TOTAL, T3FREE, THYROIDAB in the last 72 hours.  Invalid input(s): FREET3 Anemia work up No results for input(s): VITAMINB12, FOLATE, FERRITIN, TIBC, IRON, RETICCTPCT in the last 72 hours. Urinalysis    Component Value Date/Time   COLORURINE YELLOW 04/21/2017 0403   APPEARANCEUR HAZY (A) 04/21/2017 0403   LABSPEC 1.029 04/21/2017 0403   PHURINE 5.0 04/21/2017 0403   GLUCOSEU NEGATIVE 04/21/2017 0403   HGBUR SMALL (A) 04/21/2017 0403   BILIRUBINUR NEGATIVE 04/21/2017 0403   KETONESUR NEGATIVE 04/21/2017 0403   PROTEINUR 100 (A) 04/21/2017 0403   UROBILINOGEN 0.2 12/06/2012 1540   NITRITE NEGATIVE 04/21/2017 0403   LEUKOCYTESUR NEGATIVE 04/21/2017 0403   Sepsis Labs Invalid input(s): PROCALCITONIN,  WBC,  LACTICIDVEN Microbiology Recent Results (from the past 240 hour(s))  Blood culture (routine x 2)     Status: None (Preliminary result)   Collection Time: 04/20/17 10:00 PM  Result Value Ref Range Status   Specimen Description BLOOD LEFT ARM  Final   Special Requests   Final    BOTTLES DRAWN AEROBIC AND ANAEROBIC Blood Culture adequate volume   Culture   Final    NO GROWTH 4 DAYS Performed at [facility], [address], [place], [place] [address]    Report Status PENDING  Incomplete  Blood culture (routine x 2)     Status: None (Preliminary result)   Collection Time: 04/20/17 10:00 PM  Result Value Ref Range Status  Specimen Description BLOOD RIGHT ARM  Final   Special Requests   Final    BOTTLES DRAWN  AEROBIC AND ANAEROBIC Blood Culture adequate volume   Culture   Final    NO GROWTH 4 DAYS Performed at [facility], [address], [place], [place] [address]    Report Status PENDING  Incomplete  MRSA PCR Screening     Status: None   Collection Time: 04/21/17  1:16 AM  Result Value Ref Range Status   MRSA by PCR NEGATIVE NEGATIVE Final    Comment:        The GeneXpert MRSA Assay (FDA approved for NASAL specimens only), is one component of a comprehensive MRSA colonization surveillance program. It is not intended to diagnose MRSA infection nor to guide or monitor treatment for MRSA infections.   Respiratory Panel by PCR     Status: None   Collection Time: 04/21/17  1:16 AM  Result Value Ref Range Status   Adenovirus NOT DETECTED NOT DETECTED Final   Coronavirus 229E NOT DETECTED NOT DETECTED Final   Coronavirus HKU1 NOT DETECTED NOT DETECTED Final   Coronavirus NL63 NOT DETECTED NOT DETECTED Final   Coronavirus OC43 NOT DETECTED NOT DETECTED Final   Metapneumovirus NOT DETECTED NOT DETECTED Final   Rhinovirus / Enterovirus NOT DETECTED NOT DETECTED Final   Influenza A NOT DETECTED NOT DETECTED Final   Influenza B NOT DETECTED NOT DETECTED Final   Parainfluenza Virus 1 NOT DETECTED NOT DETECTED Final   Parainfluenza Virus 2 NOT DETECTED NOT DETECTED Final   Parainfluenza Virus 3 NOT DETECTED NOT DETECTED Final   Parainfluenza Virus 4 NOT DETECTED NOT DETECTED Final   Respiratory Syncytial Virus NOT DETECTED NOT DETECTED Final   Bordetella pertussis NOT DETECTED NOT DETECTED Final   Chlamydophila pneumoniae NOT DETECTED NOT DETECTED Final   Mycoplasma pneumoniae NOT DETECTED NOT DETECTED Final     Time coordinating discharge: Over 30 minutes  SIGNED:   Kathlen Mody, MD  [facility] 04/26/2017, 9:00 AM Pager   If 7PM-7AM, please contact night-coverage [email or web address] Password [email or web address]

## 2017-04-26 NOTE — Progress Notes (Signed)
Patient has order to discharge home. Family at bedside and discharge instructions reviewed with son. IV's and telemetry removed. Dietary instruction reviewed with son and he verbalizes understanding. Patient stable and transported to son's vehicle via wheelchair.

## 2017-05-07 ENCOUNTER — Telehealth: Payer: Self-pay | Admitting: Cardiology

## 2017-05-07 MED ORDER — PRAVASTATIN SODIUM 40 MG PO TABS: ORAL_TABLET | ORAL | 3 refills | Status: AC

## 2017-05-07 NOTE — Telephone Encounter (Signed)
Spoke with pt dtr, follow up scheduled and refills sent to the pharmacy.

## 2017-05-07 NOTE — Telephone Encounter (Signed)
Pt's dtr calling to see why pt is only getting 15 tabs at a time for Prevastatin, taking 1qd.

## 2017-05-08 ENCOUNTER — Telehealth: Payer: Self-pay | Admitting: Cardiology

## 2017-05-08 NOTE — Telephone Encounter (Signed)
New message       Daughter is calling to let you know that pt had a BNP test ordered by his PCP.  His BNP level is 1255. Will pt need to be seen sooner that his scheduled appt?  Please call

## 2017-05-08 NOTE — Telephone Encounter (Signed)
Spoke with pt dtr, follow up moved up sooner.

## 2017-05-13 ENCOUNTER — Ambulatory Visit: Payer: Medicare Other | Admitting: Physician Assistant

## 2017-05-20 ENCOUNTER — Ambulatory Visit: Payer: Medicare Other | Admitting: Physician Assistant

## 2017-05-24 ENCOUNTER — Other Ambulatory Visit: Payer: Self-pay | Admitting: Cardiology

## 2017-05-26 ENCOUNTER — Other Ambulatory Visit: Payer: Self-pay | Admitting: Cardiology

## 2017-06-07 DEATH — deceased

## 2018-01-29 IMAGING — CR DG CHEST 2V
2 series · 2 of 2 positions shown · non-contrast
Comparison: 11/03/2015

CLINICAL DATA: Gradually worsening shortness of breath over past 2
days, associated wheezing and coughing, history of stroke,
hypertension, coronary artery disease, atrial fibrillation,
ulcerative colitis

EXAM:
CHEST  2 VIEW

[w chest pa]
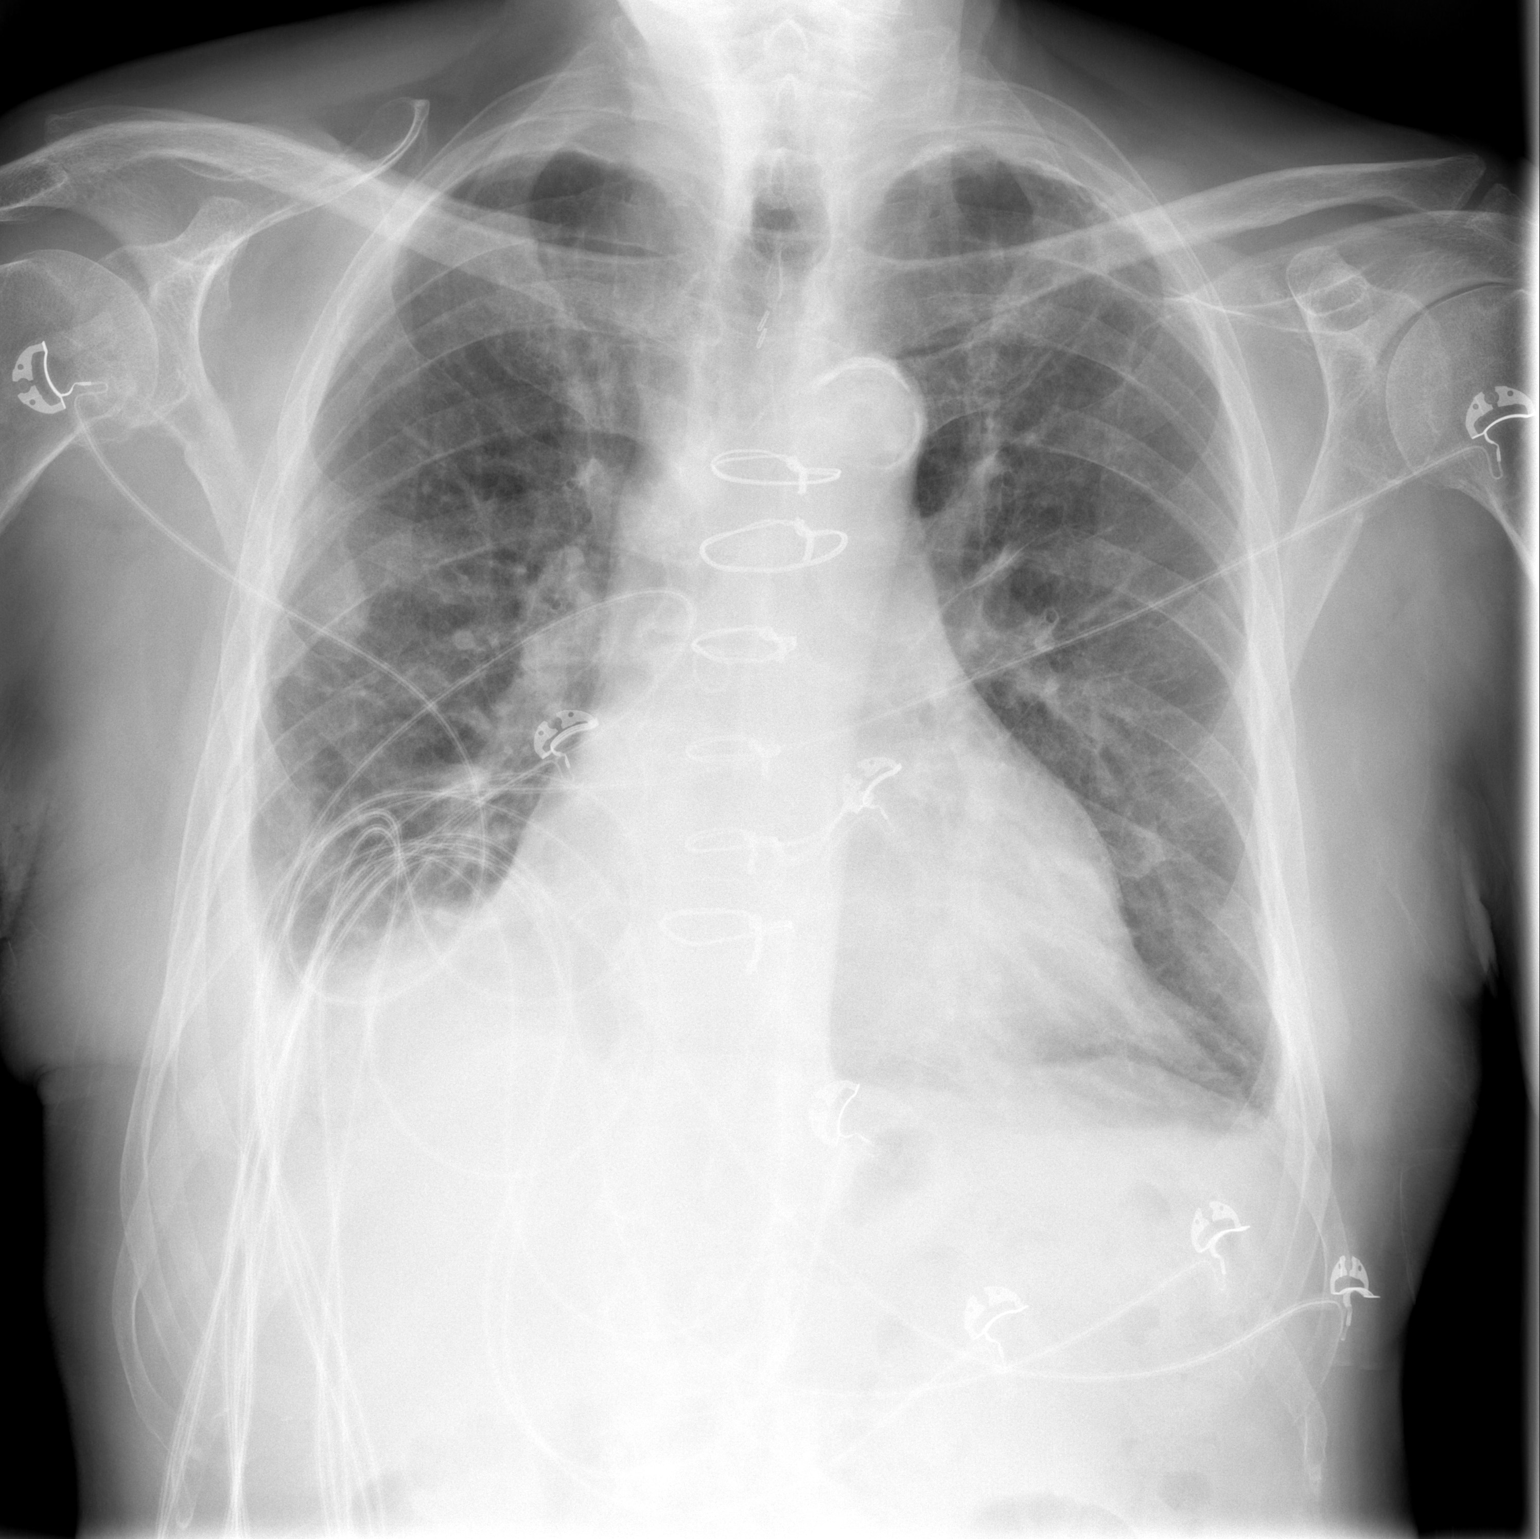

[w chest lat]
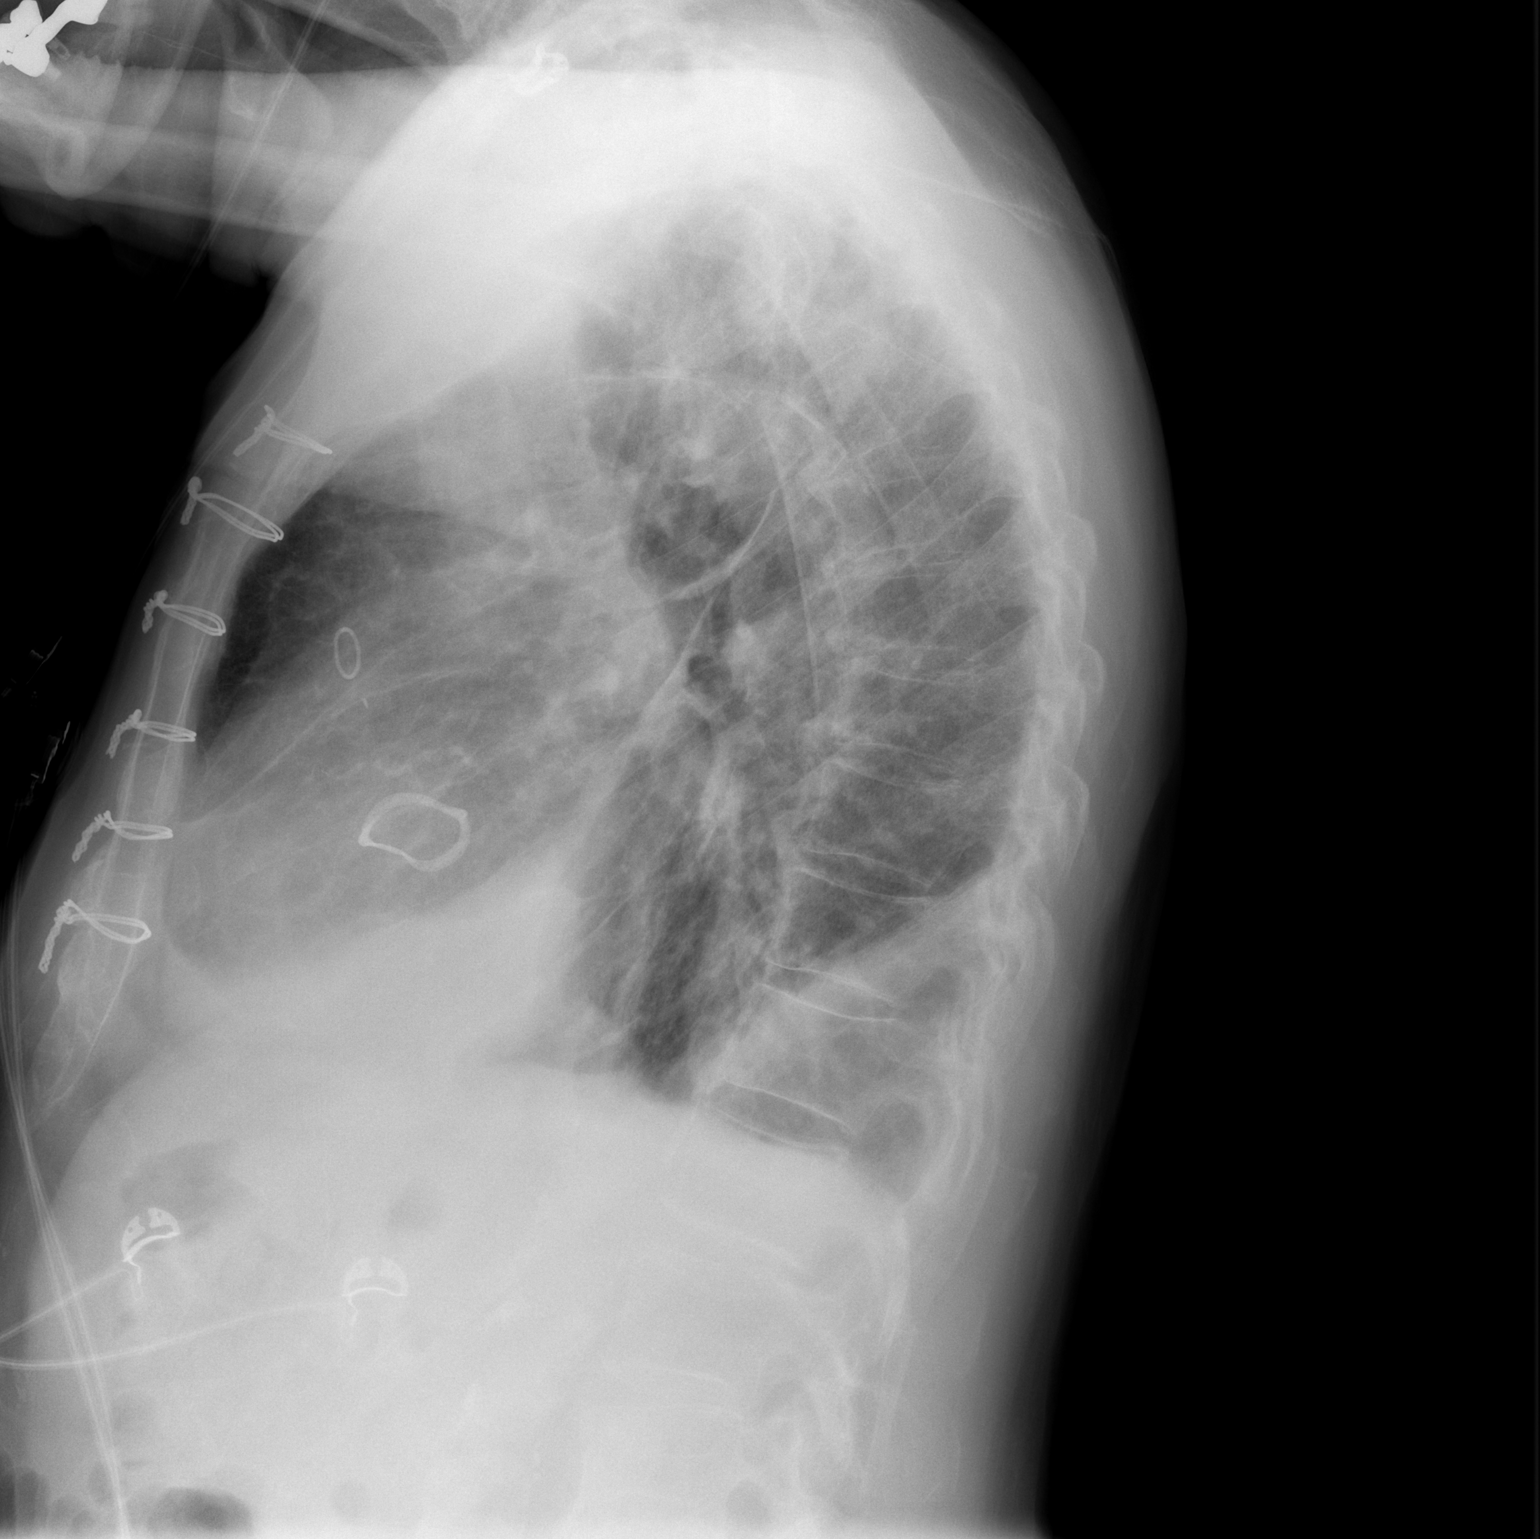

[2 of 2 positions shown; findings below may reference images not displayed]

FINDINGS: Enlargement of cardiac silhouette post CABG.

Atherosclerotic calcification aorta.

Slight pulmonary vascular congestion.

Increased RIGHT pleural effusion and basilar atelectasis.

Question loculation of fluid within the RIGHT major fissure.

Underlying emphysematous changes with minimal central peribronchial
thickening.

Remaining lungs clear.

No pneumothorax or acute osseous findings.
IMPRESSION: Enlargement of cardiac silhouette post CABG.

Aortic atherosclerosis.

COPD changes with increased RIGHT pleural effusion and basilar
atelectasis since prior study.

## 2018-04-27 IMAGING — US US ABDOMEN LIMITED
1 series · 14 of 25 positions shown · non-contrast
Comparison: Abdominal CT dated 04/21/2017

CLINICAL DATA: 89-year-old male with right upper quadrant abdominal
pain and nausea.

EXAM:
ULTRASOUND ABDOMEN LIMITED RIGHT UPPER QUADRANT

[Series 1: us abdomen limited · 0.25mm/px · 14 of 35 slices shown]
[im 1/35]
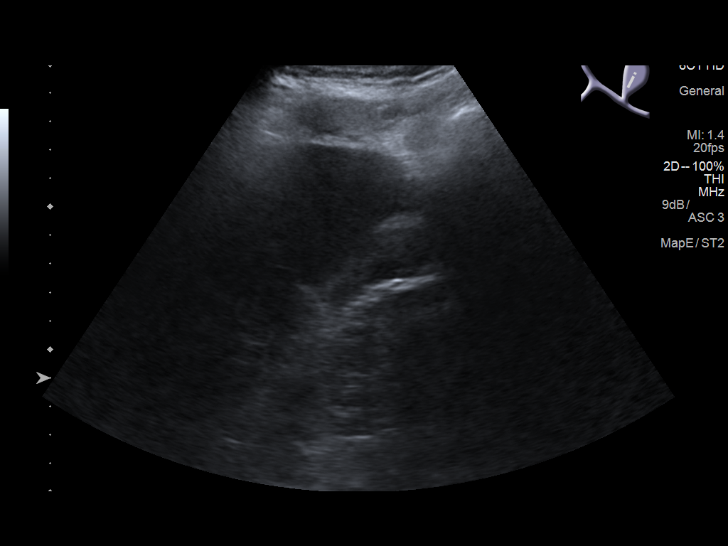
[im 3/35]
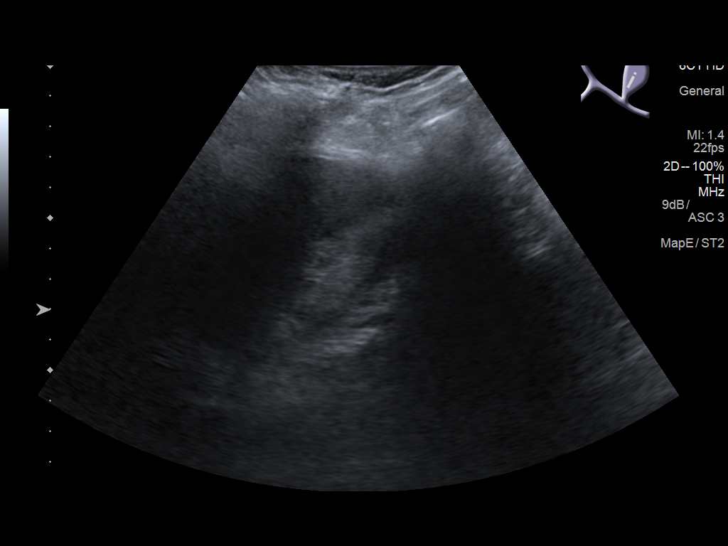
[im 6/35]
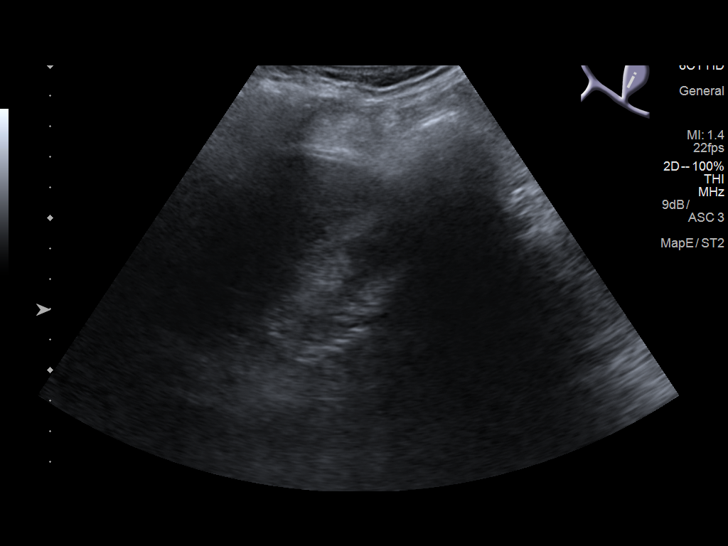
[im 9/35]
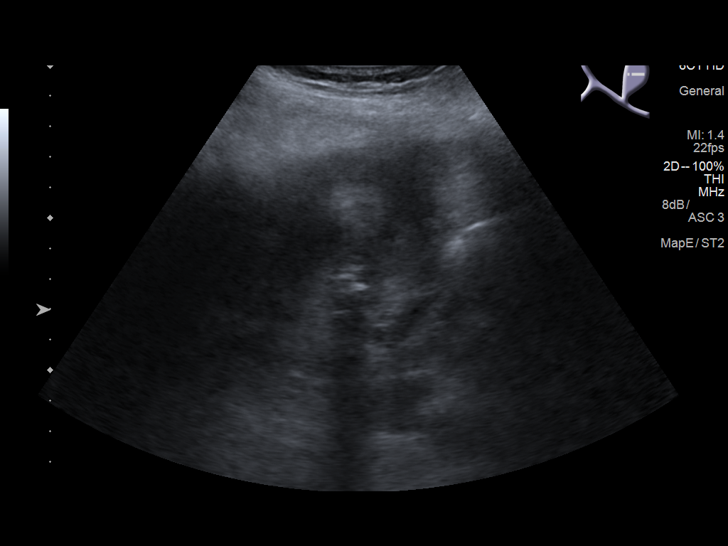
[im 12/35]
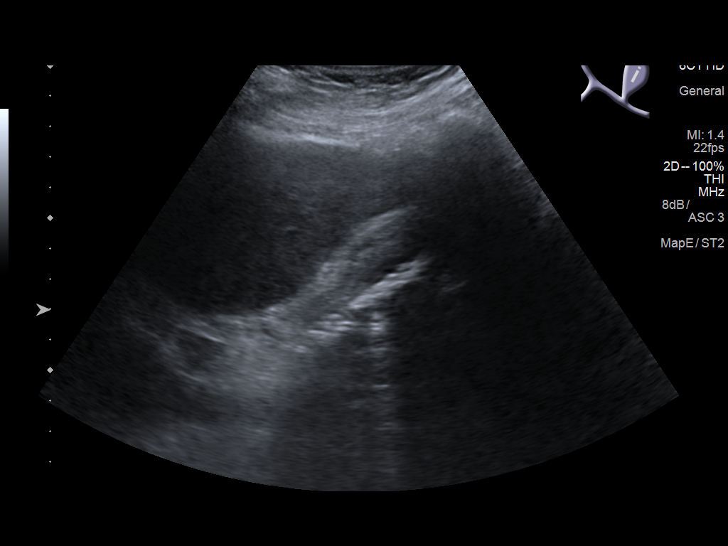
[im 13/35]
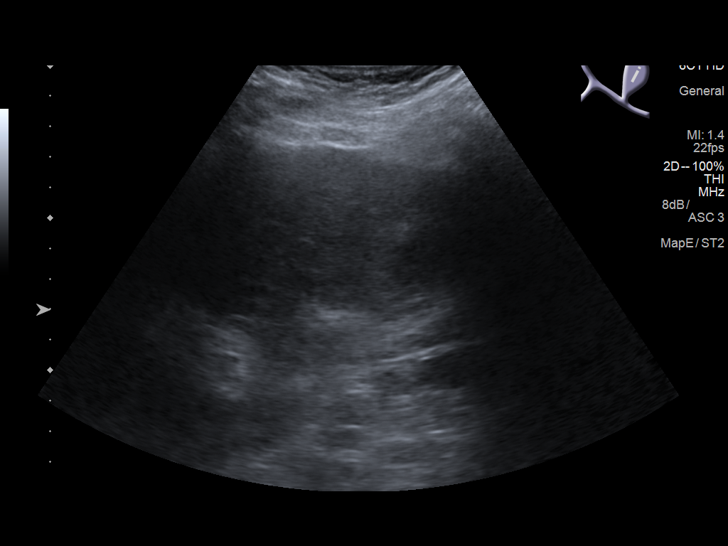
[im 16/35]
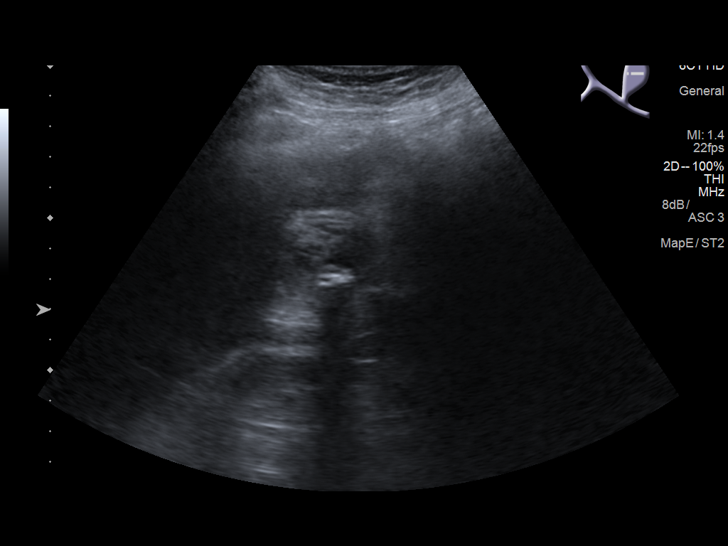
[im 19/35]
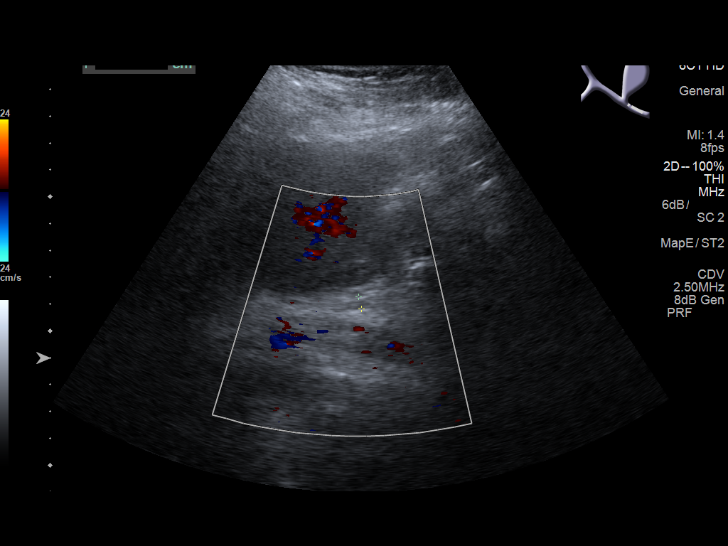
[im 22/35]
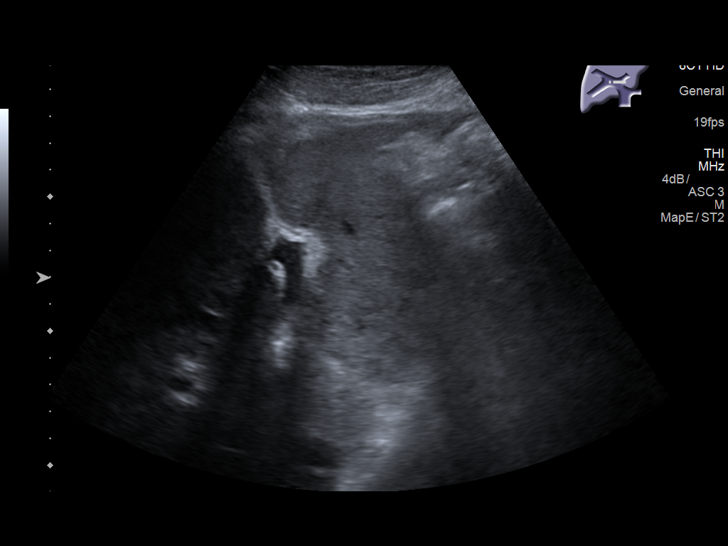
[im 23/35]
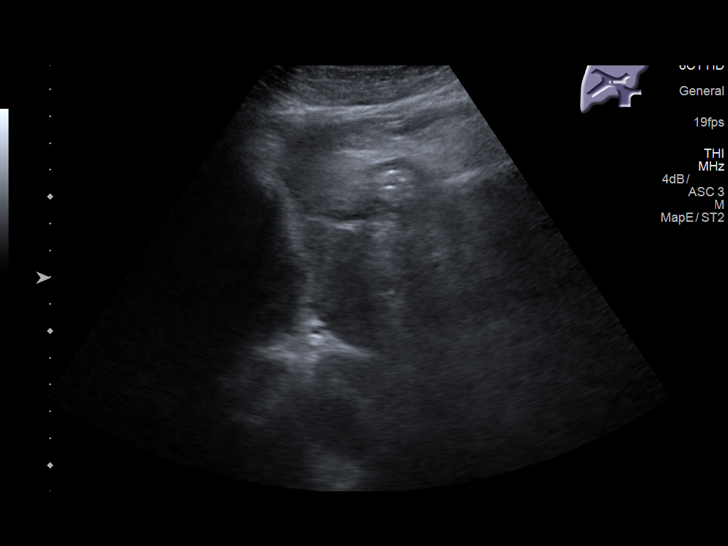
[im 26/35]
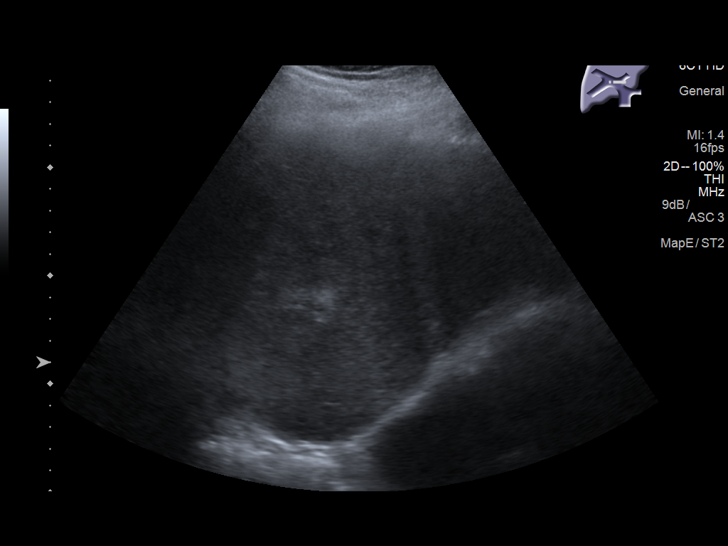
[im 29/35]
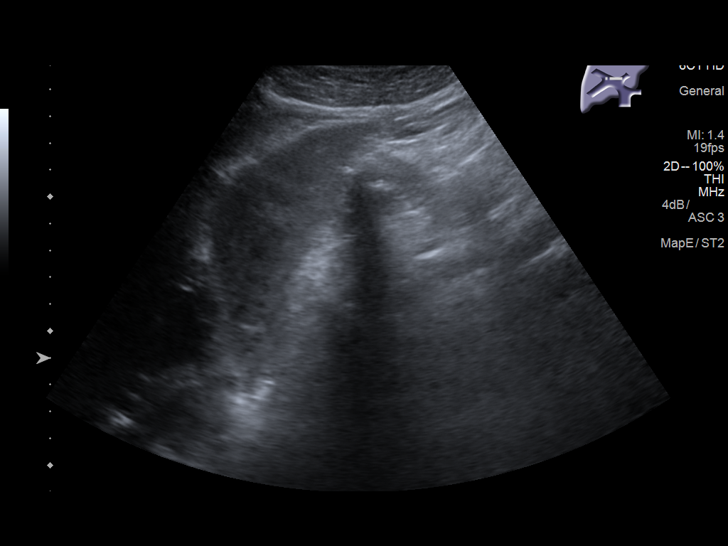
[im 32/35]
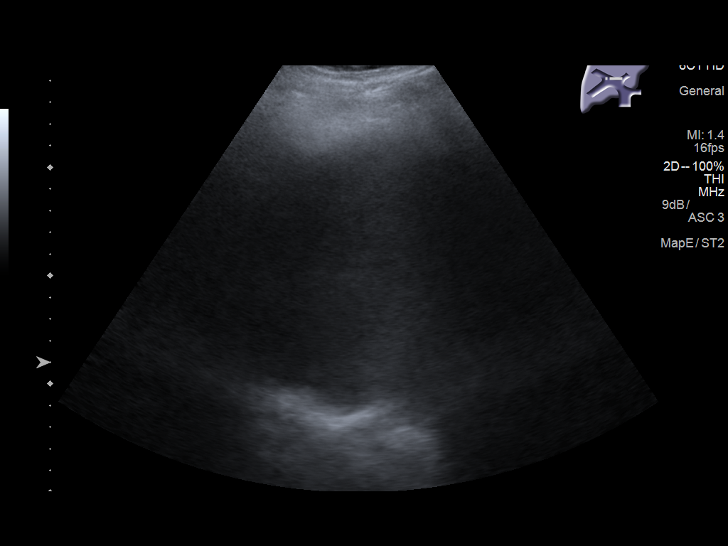
[im 35/35]
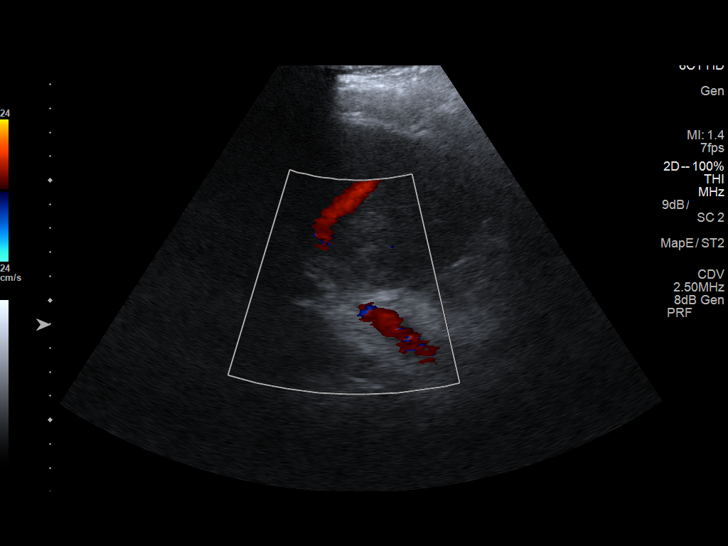

[14 of 25 positions shown; findings below may reference images not displayed]

FINDINGS: Gallbladder:

There is sludge and multiple stones within the gallbladder. The
gallbladder wall appears edematous and measures approximately 11 mm
in thickness. A small pericholecystic fluid may be present. Negative
sonographic Murphy's sign.

Common bile duct:

Diameter: 5 mm

Liver:

The liver appears unremarkable as visualized. The main portal vein
is patent with hepatopetal flow.
IMPRESSION: Cholelithiasis with thickened appearance of the gallbladder
concerning for cholecystitis, acute versus chronic. Correlation with
clinical exam recommended. A hepatobiliary scintigraphy may provide
better evaluation of the gallbladder if there is a high clinical
concern for acute cholecystitis .

## 2019-01-02 IMAGING — CT CT ABD-PELV W/O CM
2 of 4 series · 15 of 46 positions shown, 17 images · non-contrast
Comparison: None.

CLINICAL DATA: 82-year-old male with epigastric abdominal pain,
nausea vomiting.

EXAM:
CT ABDOMEN AND PELVIS WITHOUT CONTRAST
TECHNIQUE: Multidetector CT imaging of the abdomen and pelvis was performed
following the standard protocol without IV contrast.

[Series 3: abd/ pelvis 5.0 i30f 2 · axial · 0.85mm/px · z∈[+911,+1366]mm · 12 of 101 slices shown, 14 images]
[im 5/101  soft-tissue]
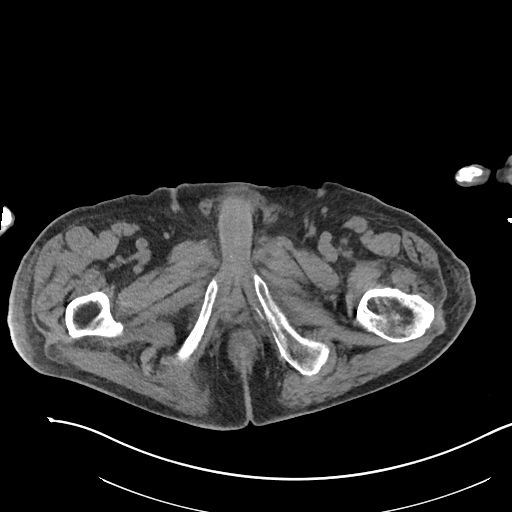
[im 5/101  bone]
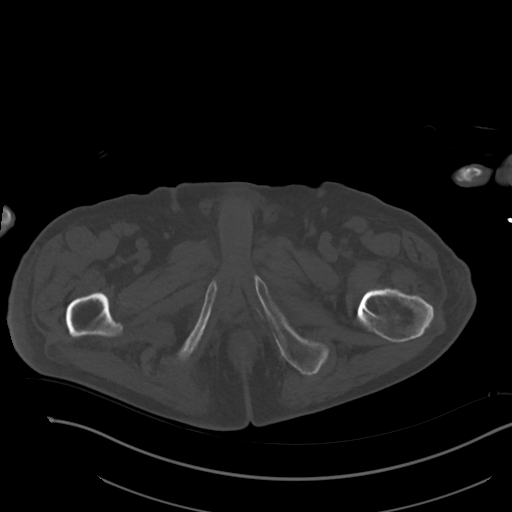
[im 13/101  soft-tissue]
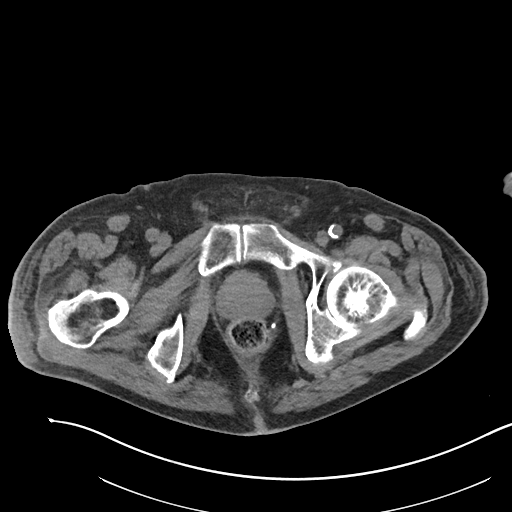
[im 21/101  soft-tissue]
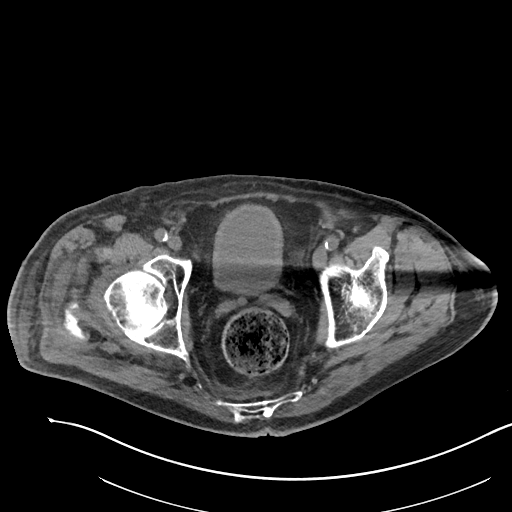
[im 30/101  soft-tissue]
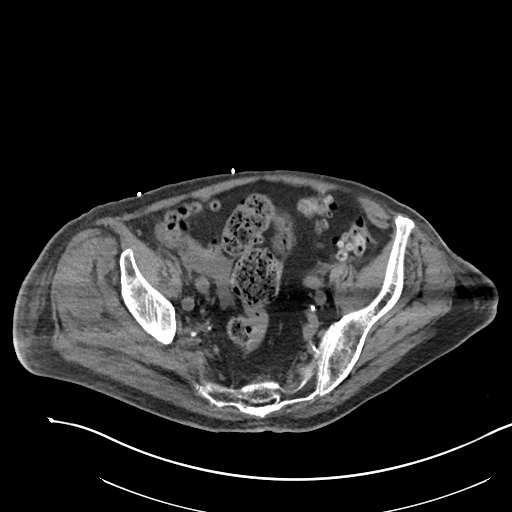
[im 38/101  soft-tissue]
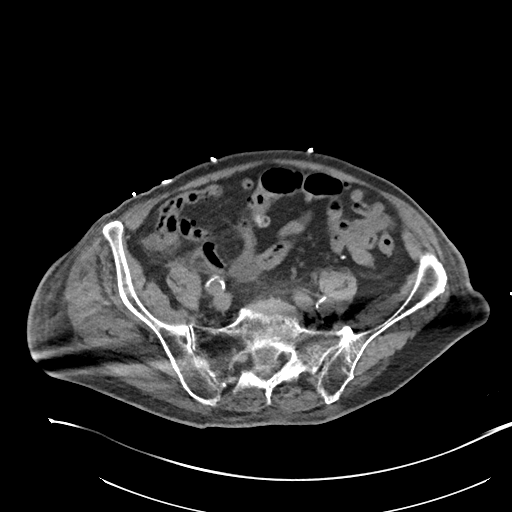
[im 46/101  soft-tissue]
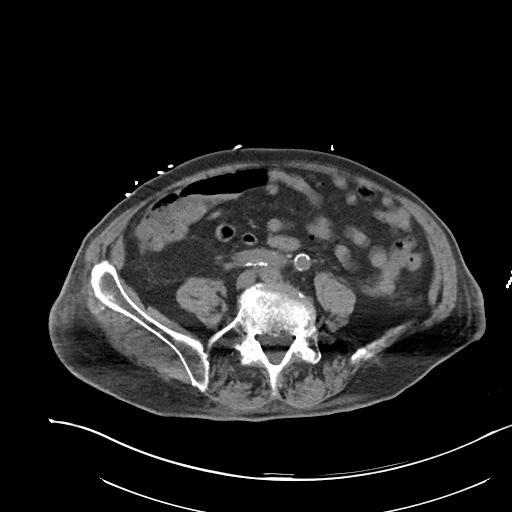
[im 55/101  soft-tissue]
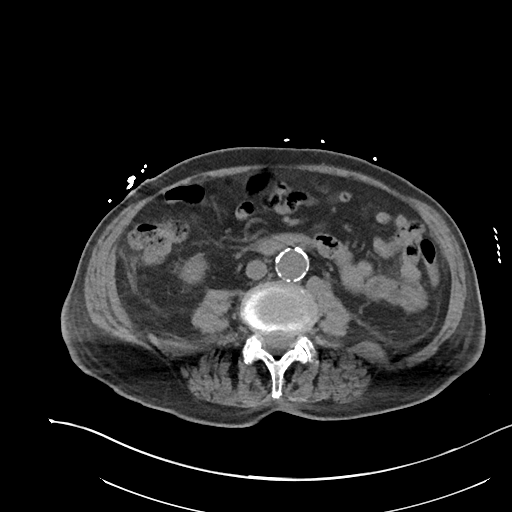
[im 63/101  soft-tissue]
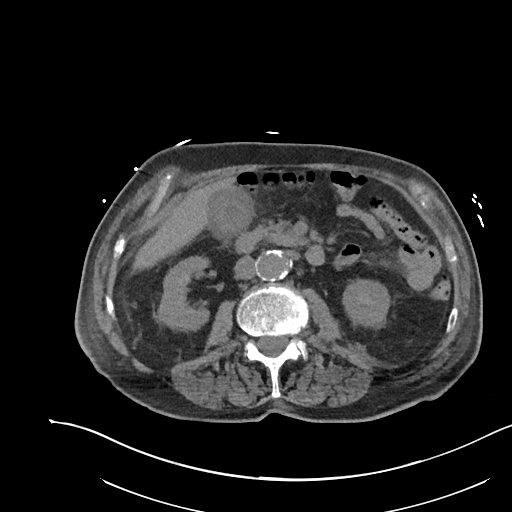
[im 71/101  soft-tissue]
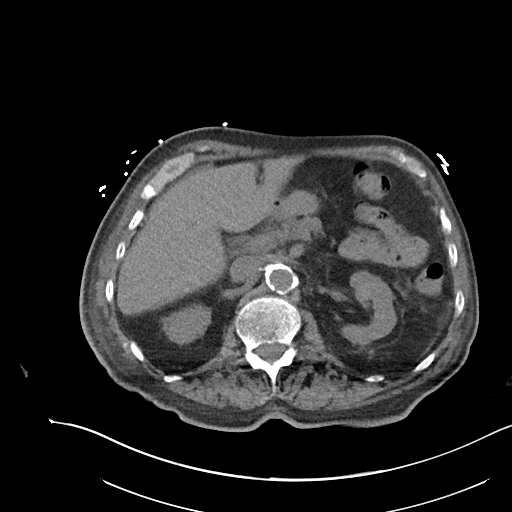
[im 71/101  bone]
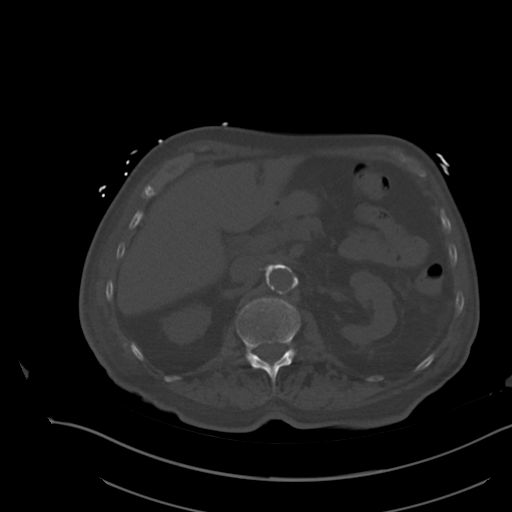
[im 80/101  soft-tissue]
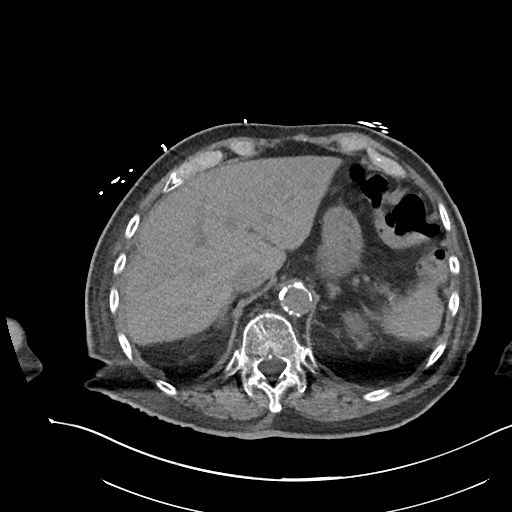
[im 88/101  soft-tissue]
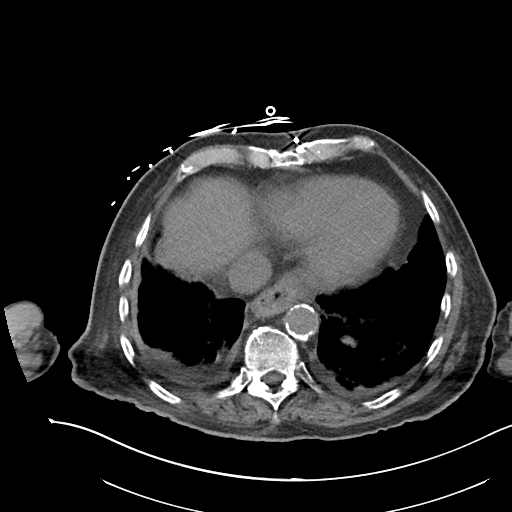
[im 96/101  soft-tissue]
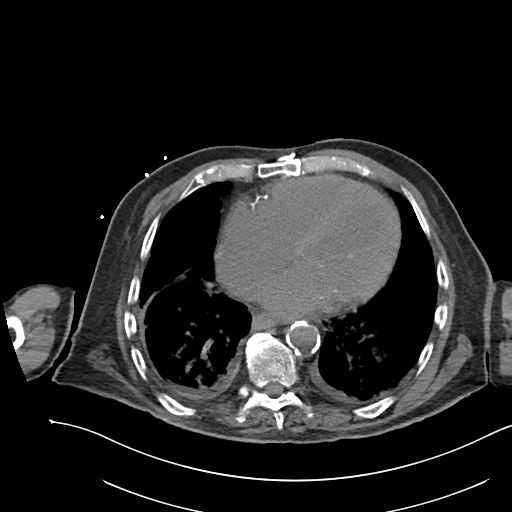

[Series 6: cor st · coronal · 0.98mm/px · 3 of 100 slices shown]
[im 34/100  soft-tissue]
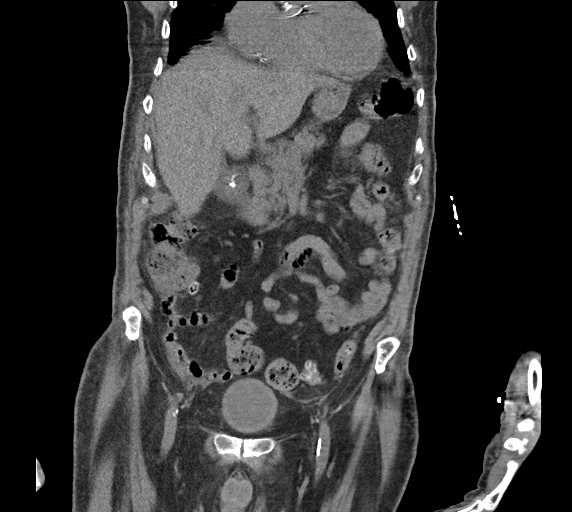
[im 45/100  soft-tissue]
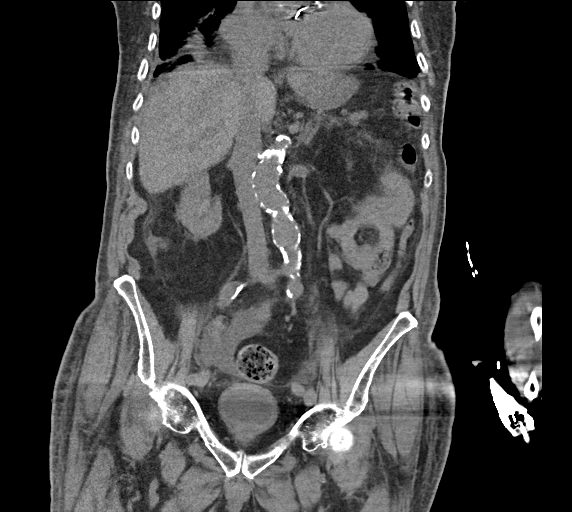
[im 56/100  soft-tissue]
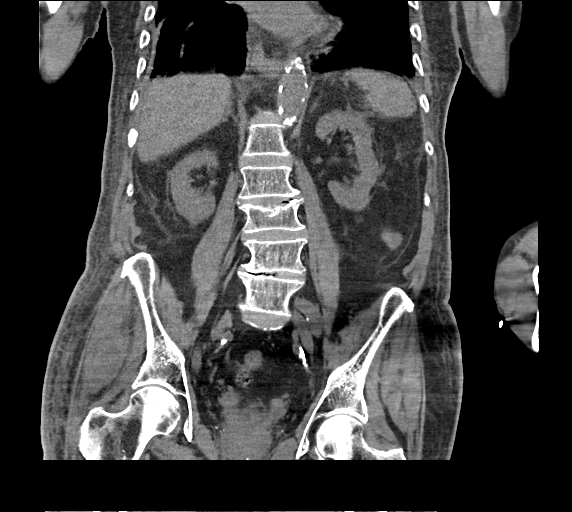

[15 of 46 positions shown; findings below may reference images not displayed]

FINDINGS: Evaluation of this exam is limited in the absence of intravenous
contrast.

Lower chest: There small bilateral pleural effusions. There are
patchy areas of ground-glass and nodular density primarily involving
the lung bases most consistent with pneumonia. Correlation with
clinical exam recommended. There is mild cardiomegaly. Aortic valve
replacement. Multi vessel coronary vascular disease. There is
hypoattenuation of the cardiac blood pool suggestive of a degree of
anemia. Clinical correlation is recommended.

There is no intra-abdominal free air. Small free fluid within the
pelvis.

Hepatobiliary: The liver is unremarkable. Small scattered calcified
liver granuloma. There is a 17 x 11 mm stone in the neck of the
gallbladder. There is diffuse thickened and inflamed appearance of
the gallbladder wall concerning for acute versus chronic
cholecystitis. Ultrasound is recommended for further evaluation of
the gallbladder.

Pancreas: Unremarkable. No pancreatic ductal dilatation or
surrounding inflammatory changes.

Spleen: Normal in size without focal abnormality.

Adrenals/Urinary Tract: The adrenal glands are unremarkable.
Indeterminate an ill-defined 15 mm left renal upper pole hypodense
lesion is not well characterized but may represent a cyst. There is
no hydronephrosis or nephrolithiasis on either side. The visualized
ureters appear unremarkable. There is diffuse thickened appearance
of the bladder wall likely related to chronic bladder outlet
obstruction. Correlation with urinalysis recommended to exclude
superimposed UTI.

Stomach/Bowel: Small hiatal hernia. There is extensive sigmoid
diverticulosis with muscular hypertrophy. No active inflammatory
changes. There is moderate stool throughout the colon. No evidence
of bowel obstruction or active inflammation. Normal appendix.

Vascular/Lymphatic: There is advanced aortoiliac atherosclerotic
disease. The aorta is mildly tortuous and ectatic measuring up to
2.8 cm. Evaluation of the vasculature is limited in the absence of
intravenous contrast. No portal venous gas identified. There is no
adenopathy.

Reproductive: The prostate and seminal vesicles are grossly
unremarkable.

Other: There is diffuse subcutaneous soft tissue edema and anasarca.

Musculoskeletal: Osteopenia with degenerative changes of the spine.
L3 superior endplate old-appearing mild compression deformity. L2-L3
and L4-L5 disc desiccation with vacuum phenomena. No acute fracture.
Median sternotomy wires noted.
IMPRESSION: 1. Small bilateral pleural effusions with patchy bilateral airspace
densities most consistent with pneumonia. Clinical correlation
recommended.
2. Cholelithiasis with thickened appearance of the gallbladder wall.
Further evaluation with ultrasound is recommended.
3. No hydronephrosis or nephrolithiasis. Correlation with urinalysis
recommended to exclude UTI.
4. Colonic diverticulosis. No bowel obstruction or active
inflammation. Normal appendix.
5. Cardiomegaly with evidence of anemia.
6. Aortic Atherosclerosis ([id number]). A 2.8 cm abdominal aortic
ectasia.
